# Patient Record
Sex: Male | Born: 1965 | Race: Black or African American | Hispanic: No | Marital: Married | Smoking: Never smoker
Health system: Southern US, Community
[De-identification: ages and names within clinical notes are randomized; demographics above are authoritative.]

## PROBLEM LIST (undated history)

## (undated) DIAGNOSIS — K432 Incisional hernia without obstruction or gangrene: Secondary | ICD-10-CM

## (undated) DIAGNOSIS — R51 Headache: Secondary | ICD-10-CM

## (undated) DIAGNOSIS — E1140 Type 2 diabetes mellitus with diabetic neuropathy, unspecified: Secondary | ICD-10-CM

## (undated) DIAGNOSIS — I10 Essential (primary) hypertension: Secondary | ICD-10-CM

## (undated) DIAGNOSIS — R519 Headache, unspecified: Secondary | ICD-10-CM

## (undated) DIAGNOSIS — E119 Type 2 diabetes mellitus without complications: Secondary | ICD-10-CM

## (undated) HISTORY — PX: OTHER SURGICAL HISTORY: SHX169

---

## 2014-01-04 DIAGNOSIS — K432 Incisional hernia without obstruction or gangrene: Secondary | ICD-10-CM

## 2014-01-04 HISTORY — PX: APPENDECTOMY: SHX54

## 2014-01-04 HISTORY — DX: Incisional hernia without obstruction or gangrene: K43.2

## 2015-05-04 ENCOUNTER — Inpatient Hospital Stay (HOSPITAL_COMMUNITY)
Admission: EM | Admit: 2015-05-04 | Discharge: 2015-05-07 | DRG: 638 | Disposition: A | Discharge status: Home / Self Care | Payer: Self-pay | Attending: Family Medicine | Admitting: Family Medicine

## 2015-05-04 ENCOUNTER — Encounter (HOSPITAL_COMMUNITY): Payer: Self-pay | Admitting: Emergency Medicine

## 2015-05-04 ENCOUNTER — Emergency Department (HOSPITAL_COMMUNITY): Payer: Self-pay

## 2015-05-04 DIAGNOSIS — R000 Tachycardia, unspecified: Secondary | ICD-10-CM | POA: Diagnosis present

## 2015-05-04 DIAGNOSIS — E871 Hypo-osmolality and hyponatremia: Secondary | ICD-10-CM | POA: Diagnosis present

## 2015-05-04 DIAGNOSIS — E1110 Type 2 diabetes mellitus with ketoacidosis without coma: Secondary | ICD-10-CM | POA: Diagnosis present

## 2015-05-04 DIAGNOSIS — E1310 Other specified diabetes mellitus with ketoacidosis without coma: Principal | ICD-10-CM | POA: Diagnosis present

## 2015-05-04 DIAGNOSIS — R066 Hiccough: Secondary | ICD-10-CM | POA: Diagnosis present

## 2015-05-04 DIAGNOSIS — E875 Hyperkalemia: Secondary | ICD-10-CM | POA: Diagnosis present

## 2015-05-04 DIAGNOSIS — R358 Other polyuria: Secondary | ICD-10-CM | POA: Diagnosis present

## 2015-05-04 DIAGNOSIS — E0810 Diabetes mellitus due to underlying condition with ketoacidosis without coma: Secondary | ICD-10-CM | POA: Insufficient documentation

## 2015-05-04 DIAGNOSIS — E860 Dehydration: Secondary | ICD-10-CM | POA: Diagnosis present

## 2015-05-04 DIAGNOSIS — N179 Acute kidney failure, unspecified: Secondary | ICD-10-CM | POA: Diagnosis present

## 2015-05-04 DIAGNOSIS — Z833 Family history of diabetes mellitus: Secondary | ICD-10-CM

## 2015-05-04 DIAGNOSIS — R631 Polydipsia: Secondary | ICD-10-CM | POA: Diagnosis present

## 2015-05-04 HISTORY — DX: Incisional hernia without obstruction or gangrene: K43.2

## 2015-05-04 LAB — CBC WITH DIFFERENTIAL/PLATELET
Basophils Absolute: 0 10*3/uL (ref 0.0–0.1)
Basophils Relative: 0 %
EOS ABS: 0 10*3/uL (ref 0.0–0.7)
Eosinophils Relative: 0 %
HEMATOCRIT: 46.6 % (ref 39.0–52.0)
HEMOGLOBIN: 15.4 g/dL (ref 13.0–17.0)
LYMPHS ABS: 1.2 10*3/uL (ref 0.7–4.0)
Lymphocytes Relative: 15 %
MCH: 26.8 pg (ref 26.0–34.0)
MCHC: 33 g/dL (ref 30.0–36.0)
MCV: 81 fL (ref 78.0–100.0)
MONOS PCT: 7 %
Monocytes Absolute: 0.5 10*3/uL (ref 0.1–1.0)
NEUTROS ABS: 6.3 10*3/uL (ref 1.7–7.7)
NEUTROS PCT: 79 %
Platelets: 166 10*3/uL (ref 150–400)
RBC: 5.75 MIL/uL (ref 4.22–5.81)
RDW: 16.4 % — ABNORMAL HIGH (ref 11.5–15.5)
WBC: 8 10*3/uL (ref 4.0–10.5)

## 2015-05-04 LAB — COMPREHENSIVE METABOLIC PANEL
ALT: 22 U/L (ref 17–63)
AST: 16 U/L (ref 15–41)
Albumin: 4.1 g/dL (ref 3.5–5.0)
Alkaline Phosphatase: 112 U/L (ref 38–126)
Anion gap: 24 — ABNORMAL HIGH (ref 5–15)
BUN: 25 mg/dL — ABNORMAL HIGH (ref 6–20)
CO2: 12 mmol/L — ABNORMAL LOW (ref 22–32)
Calcium: 9.2 mg/dL (ref 8.9–10.3)
Chloride: 88 mmol/L — ABNORMAL LOW (ref 101–111)
Creatinine, Ser: 2.25 mg/dL — ABNORMAL HIGH (ref 0.61–1.24)
GFR calc Af Amer: 37 mL/min — ABNORMAL LOW (ref >60)
GFR calc non Af Amer: 32 mL/min — ABNORMAL LOW (ref >60)
Glucose, Bld: 909 mg/dL (ref 65–99)
Potassium: 5.3 mmol/L — ABNORMAL HIGH (ref 3.5–5.1)
Sodium: 124 mmol/L — ABNORMAL LOW (ref 135–145)
Total Bilirubin: 1.5 mg/dL — ABNORMAL HIGH (ref 0.3–1.2)
Total Protein: 8.2 g/dL — ABNORMAL HIGH (ref 6.5–8.1)

## 2015-05-04 LAB — I-STAT ARTERIAL BLOOD GAS, ED
Acid-base deficit: 17 mmol/L — ABNORMAL HIGH (ref 0.0–2.0)
Bicarbonate: 9.3 mEq/L — ABNORMAL LOW (ref 20.0–24.0)
O2 Saturation: 94 %
Patient temperature: 97.9
TCO2: 10 mmol/L (ref 0–100)
pCO2 arterial: 23.8 mmHg — ABNORMAL LOW (ref 35.0–45.0)
pH, Arterial: 7.196 — CL (ref 7.350–7.450)
pO2, Arterial: 82 mmHg (ref 80.0–100.0)

## 2015-05-04 LAB — URINALYSIS, ROUTINE W REFLEX MICROSCOPIC
Bilirubin Urine: NEGATIVE
Glucose, UA: >1000 mg/dL — AB
Ketones, ur: 40 mg/dL — AB
Leukocytes, UA: NEGATIVE
Nitrite: NEGATIVE
Protein, ur: NEGATIVE mg/dL
Specific Gravity, Urine: 1.034 — ABNORMAL HIGH (ref 1.005–1.030)
pH: 5 (ref 5.0–8.0)

## 2015-05-04 LAB — CBG MONITORING, ED
GLUCOSE-CAPILLARY: 597 mg/dL — AB (ref 65–99)
GLUCOSE-CAPILLARY: 528 mg/dL — AB (ref 65–99)
GLUCOSE-CAPILLARY: 197 mg/dL — AB (ref 65–99)
Glucose-Capillary: 529 mg/dL — ABNORMAL HIGH (ref 65–99)
Glucose-Capillary: 389 mg/dL — ABNORMAL HIGH (ref 65–99)
Glucose-Capillary: 333 mg/dL — ABNORMAL HIGH (ref 65–99)
Glucose-Capillary: 254 mg/dL — ABNORMAL HIGH (ref 65–99)

## 2015-05-04 LAB — BASIC METABOLIC PANEL
ANION GAP: 16 — AB (ref 5–15)
ANION GAP: 13 (ref 5–15)
BUN: 25 mg/dL — ABNORMAL HIGH (ref 6–20)
BUN: 23 mg/dL — ABNORMAL HIGH (ref 6–20)
CALCIUM: 9 mg/dL (ref 8.9–10.3)
CHLORIDE: 110 mmol/L (ref 101–111)
CO2: 13 mmol/L — AB (ref 22–32)
CO2: 14 mmol/L — ABNORMAL LOW (ref 22–32)
Calcium: 8.4 mg/dL — ABNORMAL LOW (ref 8.9–10.3)
Chloride: 107 mmol/L (ref 101–111)
Creatinine, Ser: 1.76 mg/dL — ABNORMAL HIGH (ref 0.61–1.24)
Creatinine, Ser: 1.43 mg/dL — ABNORMAL HIGH (ref 0.61–1.24)
GFR calc non Af Amer: 43 mL/min — ABNORMAL LOW (ref >60)
GFR, EST AFRICAN AMERICAN: 50 mL/min — AB (ref >60)
GFR, EST NON AFRICAN AMERICAN: 55 mL/min — AB (ref >60)
GLUCOSE: 340 mg/dL — AB (ref 65–99)
Glucose, Bld: 226 mg/dL — ABNORMAL HIGH (ref 65–99)
POTASSIUM: 4 mmol/L (ref 3.5–5.1)
Potassium: 3.7 mmol/L (ref 3.5–5.1)
SODIUM: 137 mmol/L (ref 135–145)
Sodium: 136 mmol/L (ref 135–145)

## 2015-05-04 LAB — TROPONIN I: Troponin I: <0.03 ng/mL (ref <0.031)

## 2015-05-04 LAB — LIPID PANEL
CHOL/HDL RATIO: 8.6 ratio
CHOLESTEROL: 206 mg/dL — AB (ref 0–200)
HDL: 24 mg/dL — ABNORMAL LOW (ref >40)
LDL Cholesterol: 145 mg/dL — ABNORMAL HIGH (ref 0–99)
TRIGLYCERIDES: 187 mg/dL — AB (ref <150)
VLDL: 37 mg/dL (ref 0–40)

## 2015-05-04 LAB — GLUCOSE, CAPILLARY
Glucose-Capillary: 175 mg/dL — ABNORMAL HIGH (ref 65–99)
Glucose-Capillary: 183 mg/dL — ABNORMAL HIGH (ref 65–99)

## 2015-05-04 LAB — I-STAT CG4 LACTIC ACID, ED
Lactic Acid, Venous: 1.83 mmol/L (ref 0.5–2.0)
Lactic Acid, Venous: 1.71 mmol/L (ref 0.5–2.0)

## 2015-05-04 LAB — D-DIMER, QUANTITATIVE (NOT AT ARMC): D DIMER QUANT: 6.43 ug{FEU}/mL — AB (ref 0.00–0.50)

## 2015-05-04 LAB — URINE MICROSCOPIC-ADD ON
BACTERIA UA: NONE SEEN
RBC / HPF: NONE SEEN RBC/hpf (ref 0–5)
WBC, UA: NONE SEEN WBC/hpf (ref 0–5)

## 2015-05-04 LAB — MAGNESIUM: Magnesium: 2 mg/dL (ref 1.7–2.4)

## 2015-05-04 LAB — TSH: TSH: 1.505 u[IU]/mL (ref 0.350–4.500)

## 2015-05-04 MED ORDER — SODIUM CHLORIDE 0.9 % IV BOLUS (SEPSIS)
1000.0000 mL | Freq: Once | INTRAVENOUS | Status: AC
  Administered 2015-05-04: 1000 mL via INTRAVENOUS

## 2015-05-04 MED ORDER — ONDANSETRON HCL 4 MG/2ML IJ SOLN: 4.0000 mg | Freq: Once | INTRAMUSCULAR | Status: DC

## 2015-05-04 MED ORDER — SODIUM CHLORIDE 0.9 % IV SOLN
INTRAVENOUS | Status: AC
  Administered 2015-05-04: 16:00:00 via INTRAVENOUS

## 2015-05-04 MED ORDER — SODIUM CHLORIDE 0.9 % IV BOLUS (SEPSIS): 1000.0000 mL | Freq: Once | INTRAVENOUS | Status: DC

## 2015-05-04 MED ORDER — DEXTROSE-NACL 5-0.45 % IV SOLN: INTRAVENOUS | Status: DC

## 2015-05-04 MED ORDER — INSULIN REGULAR HUMAN 100 UNIT/ML IJ SOLN
INTRAMUSCULAR | Status: DC
  Administered 2015-05-04: 7.8 [IU]/h via INTRAVENOUS
  Filled 2015-05-04: qty 2.5

## 2015-05-04 MED ORDER — SODIUM CHLORIDE 0.9 % IV SOLN: INTRAVENOUS | Status: DC

## 2015-05-04 MED ORDER — CHLORPROMAZINE HCL 25 MG PO TABS
50.0000 mg | ORAL_TABLET | Freq: Once | ORAL | Status: AC
  Administered 2015-05-04: 50 mg via ORAL
  Filled 2015-05-04: qty 2

## 2015-05-04 MED ORDER — CHLORPROMAZINE HCL 50 MG PO TABS
50.0000 mg | ORAL_TABLET | Freq: Four times a day (QID) | ORAL | Status: DC | PRN
  Administered 2015-05-04 – 2015-05-07 (×7): 50 mg via ORAL
  Filled 2015-05-04 (×8): qty 1
  Filled 2015-05-04: qty 2
  Filled 2015-05-04 (×3): qty 1

## 2015-05-04 MED ORDER — SODIUM CHLORIDE 0.9 % IV SOLN
INTRAVENOUS | Status: DC
  Administered 2015-05-04: 20:00:00 via INTRAVENOUS

## 2015-05-04 MED ORDER — ENOXAPARIN SODIUM 40 MG/0.4ML ~~LOC~~ SOLN
40.0000 mg | SUBCUTANEOUS | Status: DC
  Administered 2015-05-04 – 2015-05-06 (×3): 40 mg via SUBCUTANEOUS
  Filled 2015-05-04 (×4): qty 0.4

## 2015-05-04 MED ORDER — SODIUM CHLORIDE 0.9 % IV SOLN
INTRAVENOUS | Status: DC
  Administered 2015-05-04: 5.3 [IU]/h via INTRAVENOUS
  Filled 2015-05-04: qty 2.5

## 2015-05-04 MED ORDER — DEXTROSE-NACL 5-0.45 % IV SOLN
INTRAVENOUS | Status: DC
  Administered 2015-05-04: 22:00:00 via INTRAVENOUS

## 2015-05-04 NOTE — ED Notes (Signed)
Pt. Stated, I've had hiccups for a week.

## 2015-05-04 NOTE — Progress Notes (Signed)
ABG critical values given to G. Jamie BrookesNikolich, Charity fundraiserN by Kathie RhodesS. Shelly Shoultz, RRT at 13:55 on 05/04/2015.

## 2015-05-04 NOTE — ED Notes (Signed)
Critical lab glucose 909 notified Dr Manus Gunningancour.

## 2015-05-04 NOTE — ED Provider Notes (Signed)
CSN: [id number]     Arrival date & time 05/04/15  0831 History   First MD Initiated Contact with Patient 05/04/15 1139     Chief Complaint  Patient presents with  . Hiccups     (Consider location/radiation/quality/duration/timing/severity/associated sxs/prior Treatment) HPI Comments: Patient reports persistent hiccups for the past one week, that come and go. History of similar problem in the past after he had surgery. States he said that at some point that he does have some few episodes of vomiting. Also has fleeting pain in his chest lasting just a few seconds when hiccups are severe. Denies any cardiac history or pulmonary history. He is not a smoker. Denies any fever or chills. Denies any diarrhea or constipation. Denies any testicular pain. States he has a large ventral hernia after having appendectomy surgery last year that he is scheduled to have fixed. It is not hurting him today. Denies any fever.  The history is provided by the patient and a relative.    Past Medical History  Diagnosis Date  . Incisional hernia 2016   Past Surgical History  Procedure Laterality Date  . Appendectomy  2016    open   Family History  Problem Relation Age of Onset  . Diabetes Father    Social History  Substance Use Topics  . Smoking status: Never Smoker   . Smokeless tobacco: Never Used  . Alcohol Use: Yes     Comment: 2 beers a week    Review of Systems  Constitutional: Positive for activity change, appetite change and fatigue. Negative for fever.  HENT: Negative for congestion and rhinorrhea.   Respiratory: Negative for cough, chest tightness and shortness of breath.   Cardiovascular: Negative for chest pain.  Gastrointestinal: Negative for nausea, vomiting, abdominal pain and diarrhea.  Genitourinary: Negative for dysuria, urgency and hematuria.  Musculoskeletal: Negative for myalgias and arthralgias.  Skin: Negative for rash.  Neurological: Positive for weakness. Negative for  dizziness and headaches.  A complete 10 system review of systems was obtained and all systems are negative except as noted in the HPI and PMH.      Allergies  Review of patient's allergies indicates no known allergies.  Home Medications   Prior to Admission medications   Medication Sig Start Date End Date Taking? Authorizing Provider  naproxen sodium (ALEVE) 220 MG tablet Take 220 mg by mouth 2 (two) times daily as needed.   Yes Historical Provider, MD   BP 106/75 mmHg  Pulse 123  Temp(Src) 97.9 F (36.6 C) (Oral)  Resp 22  Wt 234 lb 1.6 oz (106.187 kg)  SpO2 95% Physical Exam  Constitutional: He is oriented to person, place, and time. He appears well-developed and well-nourished. No distress.  HENT:  Head: Normocephalic and atraumatic.  Mouth/Throat: Oropharynx is clear and moist. No oropharyngeal exudate.  Dry mucous membranes, actively hiccuping  Eyes: Conjunctivae and EOM are normal. Pupils are equal, round, and reactive to light.  Neck: Normal range of motion. Neck supple.  No meningismus.  Cardiovascular: Normal rate, normal heart sounds and intact distal pulses.   No murmur heard. tachycardic  Pulmonary/Chest: Effort normal and breath sounds normal. No respiratory distress.  Abdominal: Soft. There is no tenderness. There is no rebound and no guarding.  Large surgical incision in the midline with ventral hernia extending inferiorly. It is nontender. It is soft and reducible.  Musculoskeletal: Normal range of motion. He exhibits no edema or tenderness.  Neurological: He is alert and oriented to person, place,  and time. No cranial nerve deficit. He exhibits normal muscle tone. Coordination normal.  No ataxia on finger to nose bilaterally. No pronator drift. 5/5 strength throughout. CN 2-12 intact.Equal grip strength. Sensation intact.   Skin: Skin is warm.  Psychiatric: He has a normal mood and affect. His behavior is normal.  Nursing note and vitals reviewed.   ED  Course  Procedures (including critical care time) Labs Review Labs Reviewed  CBC WITH DIFFERENTIAL/PLATELET - Abnormal; Notable for the following:    RDW 16.4 (*)    All other components within normal limits  COMPREHENSIVE METABOLIC PANEL - Abnormal; Notable for the following:    Sodium 124 (*)    Potassium 5.3 (*)    Chloride 88 (*)    CO2 12 (*)    Glucose, Bld 909 (*)    BUN 25 (*)    Creatinine, Ser 2.25 (*)    Total Protein 8.2 (*)    Total Bilirubin 1.5 (*)    GFR calc non Af Amer 32 (*)    GFR calc Af Amer 37 (*)    Anion gap 24 (*)    All other components within normal limits  D-DIMER, QUANTITATIVE (NOT AT [facility]) - Abnormal; Notable for the following:    D-Dimer, Quant 6.43 (*)    All other components within normal limits  URINALYSIS, ROUTINE W REFLEX MICROSCOPIC (NOT AT [facility]) - Abnormal; Notable for the following:    APPearance CLOUDY (*)    Specific Gravity, Urine 1.034 (*)    Glucose, UA >1000 (*)    Hgb urine dipstick TRACE (*)    Ketones, ur 40 (*)    All other components within normal limits  URINE MICROSCOPIC-ADD ON - Abnormal; Notable for the following:    Squamous Epithelial / LPF 0-5 (*)    All other components within normal limits  I-STAT ARTERIAL BLOOD GAS, ED - Abnormal; Notable for the following:    pH, Arterial 7.196 (*)    pCO2 arterial 23.8 (*)    Bicarbonate 9.3 (*)    Acid-base deficit 17.0 (*)    All other components within normal limits  CBG MONITORING, ED - Abnormal; Notable for the following:    Glucose-Capillary 597 (*)    All other components within normal limits  CBG MONITORING, ED - Abnormal; Notable for the following:    Glucose-Capillary 529 (*)    All other components within normal limits  TROPONIN I  I-STAT CG4 LACTIC ACID, ED  I-STAT CG4 LACTIC ACID, ED    Imaging Review Dg Chest 2 View  05/04/2015  CLINICAL DATA:  Hiccoughs EXAM: CHEST  2 VIEW COMPARISON:  None. FINDINGS: Normal heart size. Possible posterior mid lung nodule  is noted on the lateral view. No pneumothorax. No pleural effusion. IMPRESSION: Possible lung nodule. Comparison a prior studies are recommended. If none are available, CT can be performed. Electronically Signed   By: Jolaine Click M.D.   On: 05/04/2015 13:58   I have personally reviewed and evaluated these images and lab results as part of my medical decision-making.   EKG Interpretation   Date/Time:  Sunday May 04 2015 12:08:04 EDT Ventricular Rate:  109 PR Interval:  144 QRS Duration: 106 QT Interval:  342 QTC Calculation: 460 R Axis:   76 Text Interpretation:  Sinus tachycardia No previous ECGs available  Confirmed by Manus Gunning  MD, Coby Antrobus [phone]) on 05/04/2015 12:28:21 PM      MDM   Final diagnoses:  Diabetic ketoacidosis without coma  associated with diabetes mellitus due to underlying condition (HCC)  Hyponatremia  Acute renal failure, unspecified acute renal failure type (HCC)   Hiccups for the past one week with intermittent nausea and vomiting. Ventral hernia is soft.   Patient is tachycardic in the 100s. He'll be started on IV fluids given his dry appareance  Labs show evidence of new diabetes and DKA. Sugar is 900 with hyperkalemia and hyponatremia and elevated anion gap with acute renal failure.  Start IV insulin and DKA protocol with additional IV fluids. PH 7.19. Patient mentating well protecting airway. Unassigned admission d/w [facility] residents.  They are aware of elevated D-dimer. May require VQ scan as renal failure precludes CTPE.  Remains tachycardic to 110-120s but no hypoxia or respiratory distress.   CRITICAL CARE Performed by: Glynn Octave Total critical care time: 45 minutes Critical care time was exclusive of separately billable procedures and treating other patients. Critical care was necessary to treat or prevent imminent or life-threatening deterioration. Critical care was time spent personally by me on the following activities: development of  treatment plan with patient and/or surrogate as well as nursing, discussions with consultants, evaluation of patient's response to treatment, examination of patient, obtaining history from patient or surrogate, ordering and performing treatments and interventions, ordering and review of laboratory studies, ordering and review of radiographic studies, pulse oximetry and re-evaluation of patient's condition.     Glynn Octave, MD 05/04/15 2255

## 2015-05-04 NOTE — H&P (Signed)
[facility] and Physical Service Pager: [phone]  Patient name: Mark Miles Miles Medical record number: [id number] Date of birth: [birth year] Age: 50 y.o. Gender: male  Primary Care Provider: No primary care provider on file. Consultants: none Code Status: FULL  Chief Complaint: feeling weak and hiccups  Assessment and Plan: Mark Miles Kjos is a 50 y.o. male presenting with hiccups and weakness. No significant PMH.   DKA: CBG 900, anion gap 32, K 5.3, Na 124 on admission. ABG pH 7.2, bicarb 9.3. Lactic acid WNL. No prior DM diagnosis, reports not being tested for it over past several years/limited doctor visits. Sodium corrects to 143.  - Admit to SDU, attending Dr. Gwendolyn GrantWalden - Continue insulin drip per protocol - NPO while on insulin drip, ice chips okay - Transition to subq insulin when CBG <250 - q4 BMP - F/u A1C, TSH, lipid panel, mag  Hiccups: Previously treated with benztropine.  - Begin Thorazine  AKI: Likely 2/2 dehydration. Cr 2.2 on admission.  - Cont IVF per DKA protocol - Continue to monitor   Elevated D-dimer: ordered in ED, 6.43. He is not in respiratory distress, SpO2 maintaining upper 90s-100 on room air, not complaining of dyspnea (though hiccups as above). No leg swelling. No prior history of DVT/PE. This most likely represents severe dehydration. - V/Q scan ordered in ED  FEN/GI: NPO while on insulin drip Prophylaxis: Lovenox  Disposition: admit to SDU  History of Present Illness:  Mark Miles Mark Miles is a 50 y.o. male presenting with hiccups and found to be in DKA.  Patient reports worsening hiccups for the past 5 days. Reports another prolonged episode of hiccups about six months ago, and eventually resolved after he was prescribed benztropine (though says benztropine did not really help his symptoms). He has tried various home remedies with no symptomatic relief. Patient says that he finally came to the hospital because  he was hiccuping so much that he cannot sleep and has started to feel weak.   Also endorses recent polyuria and polydipsia, as well as 20lb weight loss in past two weeks. Endorses blurry vision and vomiting over the past few days. Denies hematemesis. Denies recent illness. Denies prior diagnosis of high blood sugar or diabetes. Family history of father with diabetes.   Review Of Systems: Per HPI with the following additions: blurry vision, no SOB, CP, nauseas, polyuria but no dysuria. Otherwise the remainder of the systems were negative.  Patient Active Problem List   Diagnosis Date Noted  . DKA (diabetic ketoacidoses) (HCC) 05/04/2015    Past Medical History: Past Medical History  Diagnosis Date  . Incisional hernia 2016    Denies any history of medical problems  Past Surgical History: Past Surgical History  Procedure Laterality Date  . Appendectomy  2016    open    Social History: Social History  Substance Use Topics  . Smoking status: Never Smoker   . Smokeless tobacco: Never Used  . Alcohol Use: Yes     Comment: 2 beers a week   Additional social history: none Please also refer to relevant sections of EMR.  Family History: Family History  Problem Relation Age of Onset  . Diabetes Father     Allergies and Medications: No Known Allergies No current facility-administered medications on file prior to encounter.   No current outpatient prescriptions on file prior to encounter.  Does not take anything regularly.  Objective: BP 130/74 mmHg  Pulse 100  Temp(Src) 97.9 F (36.6 C) (Oral)  Resp 20  Wt 234 lb 1.6 oz (106.187 kg)  SpO2 97% Exam: General: sitting in bed in NAD; hiccupping every 5-15s throughout encounter Eyes: PERRLA, EOMI, no scleral icterus or discharge ENTM: slightly dry mucous membranes; no oropharyngeal erythema or exudates Neck: supple, no lymphadenopathy Cardiovascular: RRR, no murmurs appreciated Respiratory: CTAB, no wheezes Abdomen:  large ventral hernia with overlying well-healed surgical incision, soft, non-tender, reducible; abdomen soft, non-tender, +BS  MSK: 5/5 strength upper and lower extremities bilaterally Skin: warm and dry; no rashes or bruises noted Neuro: A&Ox3; no focal deficits Psych: appropriate mood and affect  Labs and Imaging: CBC BMET   Recent Labs Lab 05/04/15 1220  WBC 8.0  HGB 15.4  HCT 46.6  PLT 166    Recent Labs Lab 05/04/15 1220  NA 124*  K 5.3*  CL 88*  CO2 12*  BUN 25*  CREATININE 2.25*  GLUCOSE 909*  CALCIUM 9.2     Dg Chest 2 View  05/04/2015  CLINICAL DATA:  Hiccoughs EXAM: CHEST  2 VIEW COMPARISON:  None. FINDINGS: Normal heart size. Possible posterior mid lung nodule is noted on the lateral view. No pneumothorax. No pleural effusion. IMPRESSION: Possible lung nodule. Comparison a prior studies are recommended. If none are available, CT can be performed. Electronically Signed   By: Jolaine Click M.D.   On: 05/04/2015 13:58    Abigail Shelbie Hutching, MD 05/04/2015, 3:13 PM PGY-1, [facility] Family Medicine FPTS Intern pager: [phone], text pages welcome  I have seen and examined the patient. I have read and agree with the above note. My changes are noted in blue.  Tawni Carnes, MD 05/04/2015, 3:45 PM PGY-3, [facility] Family Medicine FPTS Intern Pager: [phone], text pages welcome

## 2015-05-05 LAB — BASIC METABOLIC PANEL
ANION GAP: 10 (ref 5–15)
Anion gap: 10 (ref 5–15)
Anion gap: 11 (ref 5–15)
Anion gap: 9 (ref 5–15)
Anion gap: 9 (ref 5–15)
BUN: 13 mg/dL (ref 6–20)
BUN: 16 mg/dL (ref 6–20)
BUN: 18 mg/dL (ref 6–20)
BUN: 19 mg/dL (ref 6–20)
BUN: 21 mg/dL — AB (ref 6–20)
CALCIUM: 8.3 mg/dL — AB (ref 8.9–10.3)
CALCIUM: 8.3 mg/dL — AB (ref 8.9–10.3)
CALCIUM: 8.4 mg/dL — AB (ref 8.9–10.3)
CHLORIDE: 107 mmol/L (ref 101–111)
CHLORIDE: 109 mmol/L (ref 101–111)
CHLORIDE: 111 mmol/L (ref 101–111)
CHLORIDE: 112 mmol/L — AB (ref 101–111)
CO2: 16 mmol/L — ABNORMAL LOW (ref 22–32)
CO2: 17 mmol/L — AB (ref 22–32)
CO2: 16 mmol/L — AB (ref 22–32)
CO2: 18 mmol/L — AB (ref 22–32)
CO2: 18 mmol/L — AB (ref 22–32)
CREATININE: 1 mg/dL (ref 0.61–1.24)
CREATININE: 1.12 mg/dL (ref 0.61–1.24)
CREATININE: 1.14 mg/dL (ref 0.61–1.24)
CREATININE: 1.25 mg/dL — AB (ref 0.61–1.24)
Calcium: 7.9 mg/dL — ABNORMAL LOW (ref 8.9–10.3)
Calcium: 8 mg/dL — ABNORMAL LOW (ref 8.9–10.3)
Chloride: 102 mmol/L (ref 101–111)
Creatinine, Ser: 1.22 mg/dL (ref 0.61–1.24)
GFR calc Af Amer: >60 mL/min (ref >60)
GFR calc Af Amer: >60 mL/min (ref >60)
GFR calc Af Amer: >60 mL/min (ref >60)
GFR calc Af Amer: >60 mL/min (ref >60)
GFR calc Af Amer: >60 mL/min (ref >60)
GFR calc non Af Amer: >60 mL/min (ref >60)
GFR calc non Af Amer: >60 mL/min (ref >60)
GFR calc non Af Amer: >60 mL/min (ref >60)
GFR calc non Af Amer: >60 mL/min (ref >60)
GFR calc non Af Amer: >60 mL/min (ref >60)
GLUCOSE: 161 mg/dL — AB (ref 65–99)
GLUCOSE: 169 mg/dL — AB (ref 65–99)
GLUCOSE: 201 mg/dL — AB (ref 65–99)
GLUCOSE: 298 mg/dL — AB (ref 65–99)
GLUCOSE: 444 mg/dL — AB (ref 65–99)
POTASSIUM: 3.5 mmol/L (ref 3.5–5.1)
POTASSIUM: 3.7 mmol/L (ref 3.5–5.1)
Potassium: 3.2 mmol/L — ABNORMAL LOW (ref 3.5–5.1)
Potassium: 3.6 mmol/L (ref 3.5–5.1)
Potassium: 3.6 mmol/L (ref 3.5–5.1)
SODIUM: 133 mmol/L — AB (ref 135–145)
SODIUM: 129 mmol/L — AB (ref 135–145)
Sodium: 136 mmol/L (ref 135–145)
Sodium: 138 mmol/L (ref 135–145)
Sodium: 139 mmol/L (ref 135–145)

## 2015-05-05 LAB — GLUCOSE, CAPILLARY
GLUCOSE-CAPILLARY: 138 mg/dL — AB (ref 65–99)
GLUCOSE-CAPILLARY: 142 mg/dL — AB (ref 65–99)
GLUCOSE-CAPILLARY: 163 mg/dL — AB (ref 65–99)
GLUCOSE-CAPILLARY: 189 mg/dL — AB (ref 65–99)
GLUCOSE-CAPILLARY: 219 mg/dL — AB (ref 65–99)
GLUCOSE-CAPILLARY: 289 mg/dL — AB (ref 65–99)
GLUCOSE-CAPILLARY: 298 mg/dL — AB (ref 65–99)
Glucose-Capillary: 136 mg/dL — ABNORMAL HIGH (ref 65–99)
Glucose-Capillary: 187 mg/dL — ABNORMAL HIGH (ref 65–99)
Glucose-Capillary: 165 mg/dL — ABNORMAL HIGH (ref 65–99)
Glucose-Capillary: 138 mg/dL — ABNORMAL HIGH (ref 65–99)
Glucose-Capillary: 364 mg/dL — ABNORMAL HIGH (ref 65–99)
Glucose-Capillary: 445 mg/dL — ABNORMAL HIGH (ref 65–99)

## 2015-05-05 LAB — MRSA PCR SCREENING: MRSA by PCR: NEGATIVE

## 2015-05-05 MED ORDER — INSULIN GLARGINE 100 UNIT/ML ~~LOC~~ SOLN
50.0000 [IU] | Freq: Every day | SUBCUTANEOUS | Status: DC
  Filled 2015-05-05: qty 0.5

## 2015-05-05 MED ORDER — INSULIN ASPART 100 UNIT/ML ~~LOC~~ SOLN
8.0000 [IU] | Freq: Once | SUBCUTANEOUS | Status: AC
  Administered 2015-05-05: 8 [IU] via SUBCUTANEOUS

## 2015-05-05 MED ORDER — INSULIN ASPART 100 UNIT/ML ~~LOC~~ SOLN: 0.0000 [IU] | Freq: Every day | SUBCUTANEOUS | Status: DC

## 2015-05-05 MED ORDER — LIVING WELL WITH DIABETES BOOK
Freq: Once | Status: AC
  Administered 2015-05-05: 12:00:00
  Filled 2015-05-05: qty 1

## 2015-05-05 MED ORDER — INSULIN ASPART 100 UNIT/ML ~~LOC~~ SOLN
0.0000 [IU] | Freq: Three times a day (TID) | SUBCUTANEOUS | Status: DC
  Administered 2015-05-06: 11 [IU] via SUBCUTANEOUS

## 2015-05-05 MED ORDER — INSULIN GLARGINE 100 UNIT/ML ~~LOC~~ SOLN
40.0000 [IU] | Freq: Every day | SUBCUTANEOUS | Status: DC
  Filled 2015-05-05: qty 0.4

## 2015-05-05 MED ORDER — INSULIN ASPART 100 UNIT/ML ~~LOC~~ SOLN
0.0000 [IU] | Freq: Three times a day (TID) | SUBCUTANEOUS | Status: DC
  Administered 2015-05-05: 7 [IU] via SUBCUTANEOUS
  Administered 2015-05-05: 5 [IU] via SUBCUTANEOUS

## 2015-05-05 MED ORDER — POTASSIUM CHLORIDE 10 MEQ/100ML IV SOLN
10.0000 meq | INTRAVENOUS | Status: AC
  Administered 2015-05-05 (×2): 10 meq via INTRAVENOUS
  Filled 2015-05-05: qty 100

## 2015-05-05 MED ORDER — INSULIN GLARGINE 100 UNIT/ML ~~LOC~~ SOLN
40.0000 [IU] | Freq: Every day | SUBCUTANEOUS | Status: DC
  Administered 2015-05-05: 40 [IU] via SUBCUTANEOUS
  Filled 2015-05-05: qty 0.4

## 2015-05-05 MED ORDER — SODIUM CHLORIDE 0.9 % IV SOLN
INTRAVENOUS | Status: AC
  Administered 2015-05-05: 21:00:00 via INTRAVENOUS

## 2015-05-05 NOTE — Progress Notes (Signed)
Nutrition Consult  RD consulted for nutrition education regarding new onset diabetes. Glucose was > 900 on admission.   No results found for: HGBA1C  RD provided "Carbohydrate Counting for People with Diabetes" handout from the Academy of Nutrition and Dietetics. Discussed different food groups and their effects on blood sugar, emphasizing carbohydrate-containing foods. Provided list of carbohydrates and recommended serving sizes of common foods.  Discussed importance of controlled and consistent carbohydrate intake throughout the day. Provided examples of ways to balance meals/snacks and encouraged intake of high-fiber, whole grain complex carbohydrates. Teach back method used.  Expect fair compliance. Patient not interested in OP diet education at this time. Encouraged him to contact the [facility] for further comprehensive education.  Body mass index is 30.89 kg/(m^2). Pt meets criteria for class 1 obesity based on current BMI.  Current diet order is Heart Healthy / CHO modified, patient is consuming approximately 100% of meals at this time. Labs and medications reviewed. No further nutrition interventions warranted at this time. RD contact information provided. If additional nutrition issues arise, please re-consult RD.  Joaquin CourtsKimberly Beulah Capobianco, RD, LDN, CNSC Pager [phone] After Hours Pager [phone]

## 2015-05-05 NOTE — Progress Notes (Signed)
Inpatient Diabetes Program Recommendations  AACE/ADA: New Consensus Statement on Inpatient Glycemic Control (2015)  Target Ranges:  Prepandial:   less than 140 mg/dL      Peak postprandial:   less than 180 mg/dL (1-2 hours)      Critically ill patients:  140 - 180 mg/dL   Review of Glycemic Control  Inpatient Diabetes Program Recommendations: This coordinator met with patient to discuss new diagnosis.  Pt was sleeping and not feeling like talking at this time.  He has been seen by the RD.  Left patient with a hypo/hyperglycemia handout and a "Know your Numbers" handout.  Will follow-up again tomorrow.  HgbA1C: pending Thank you  Raoul Pitch BSN, RN,CDE Inpatient Diabetes Coordinator [phone] (team pager)

## 2015-05-05 NOTE — Progress Notes (Signed)
Family Medicine Teaching Service Daily Progress Note Intern Pager: [phone]  Patient name: Mark Miles Medical record number: [id number] Date of birth: 1965-03-09 Age: 50 y.o. Gender: male  Primary Care Provider: No primary care provider on file. Consultants: None Code Status: Full   Pt Overview and Major Events to Date:   Assessment and Plan: Mark Miles is a 50 y.o. male presenting with hiccups and weakness. No significant PMH.   DKA: pH on admission 7.2, CBG 900, anion gap 32>9. Currently  K 3.6.  Lactic acid WNL. New onset Diabetes. CBG 138, 163, 219  - D/C insulin drip and D5NS at 10:30 this AM  - According to chart should get 56 units, however due to new onset will start at a  reduced rate of 40 units of Lantus  - Sensitive Sliding scale   -  K 3.6 --> 10 mEq x 2  - Follow up BMET x 3 pm s/p d/c of insulin drip  - F/u A1C - TSH wnl - Diabetic education  - Case management for PCP, insurance, and meds  Hiccups: Previously treated with benztropine.  - Continue Thorazine   Resolved AKI: Likely 2/2 dehydration. Cr 2.2 >1.14  - continue to follow creatinine   Elevated D-dimer: Patient was tachycardic in the ED, and a D-Dimer of 6.43. However patient had no respiratory distress, SpO2 maintaining upper 90s-100 on room air, not complaining of dyspnea (though hiccups as above). No leg swelling. No prior history of DVT/PE. Patient was found to have DKA, and likely severe dehydration causing tachycardia. As D-Dimer elevation is non-specific, and no significant symptoms other the tachycardia, will d/c V/Q scan.   FEN/GI: Carb modified diet Prophylaxis: Lovenox  Disposition: Transition to floor  Subjective:  Patient is hungry. Is excited about the idea of eating soon, otherwise still having hiccups.   Objective: Temp:  [97.9 F (36.6 C)-98.6 F (37 C)] 97.9 F (36.6 C) (05/01 0716) Pulse Rate:  [97-124] 111 (05/01 0245) Resp:  [11-26] 13 (05/01 0245) BP:  (90-153)/(55-104) 119/65 mmHg (05/01 0245) SpO2:  [91 %-100 %] 96 % (05/01 0245) Weight:  [234 lb 1.6 oz (106.187 kg)] 234 lb 1.6 oz (106.187 kg) (04/30 0904) Physical Exam: General: Patient lying in bed in NAD Cardiovascular: RRR, no murmurs  Respiratory: CTAB, Abdomen: BS+, no tender, no distended  Extremities: no lower extremity edema   Laboratory:  CBG (last 3)   Recent Labs  05/05/15 0351 05/05/15 0513 05/05/15 0617  GLUCAP 138* 163* 219*     Recent Labs Lab 05/04/15 1220  WBC 8.0  HGB 15.4  HCT 46.6  PLT 166    Recent Labs Lab 05/04/15 1220  05/04/15 2120 05/04/15 2348 05/05/15 0403  NA 124*  < > 137 136 139  K 5.3*  < > 3.7 3.6 3.6  CL 88*  < > 110 109 112*  CO2 12*  < > 14* 16* 18*  BUN 25*  < > 23* 21* 19  CREATININE 2.25*  < > 1.43* 1.25* 1.14  CALCIUM 9.2  < > 8.4* 8.3* 8.4*  PROT 8.2*  --   --   --   --   BILITOT 1.5*  --   --   --   --   ALKPHOS 112  --   --   --   --   ALT 22  --   --   --   --   AST 16  --   --   --   --  GLUCOSE 909*  < > 226* 201* 161*  < > = values in this interval not displayed.  Imaging/Diagnostic Tests: Dg Chest 2 View  05/04/2015  CLINICAL DATA:  Hiccoughs EXAM: CHEST  2 VIEW COMPARISON:  None. FINDINGS: Normal heart size. Possible posterior mid lung nodule is noted on the lateral view. No pneumothorax. No pleural effusion. IMPRESSION: Possible lung nodule. Comparison a prior studies are recommended. If none are available, CT can be performed. Electronically Signed   By: Jolaine ClickArthur  Hoss M.D.   On: 05/04/2015 13:58    Aleasha Fregeau Mayra ReelZahra Danniel Grenz, MD 05/05/2015, 7:21 AM PGY-1, [facility] FPTS Intern pager: [phone], text pages welcome

## 2015-05-06 LAB — HEMOGLOBIN A1C
Hgb A1c MFr Bld: 12.5 % — ABNORMAL HIGH (ref 4.8–5.6)
Mean Plasma Glucose: 312 mg/dL

## 2015-05-06 LAB — BASIC METABOLIC PANEL
ANION GAP: 10 (ref 5–15)
ANION GAP: 8 (ref 5–15)
Anion gap: 8 (ref 5–15)
BUN: 6 mg/dL (ref 6–20)
BUN: 8 mg/dL (ref 6–20)
BUN: 9 mg/dL (ref 6–20)
CALCIUM: 8 mg/dL — AB (ref 8.9–10.3)
CALCIUM: 8.3 mg/dL — AB (ref 8.9–10.3)
CHLORIDE: 108 mmol/L (ref 101–111)
CO2: 18 mmol/L — AB (ref 22–32)
CO2: 23 mmol/L (ref 22–32)
CO2: 19 mmol/L — AB (ref 22–32)
Calcium: 8.6 mg/dL — ABNORMAL LOW (ref 8.9–10.3)
Chloride: 108 mmol/L (ref 101–111)
Chloride: 109 mmol/L (ref 101–111)
Creatinine, Ser: 0.9 mg/dL (ref 0.61–1.24)
Creatinine, Ser: 0.96 mg/dL (ref 0.61–1.24)
Creatinine, Ser: 0.98 mg/dL (ref 0.61–1.24)
GFR calc Af Amer: >60 mL/min (ref >60)
GFR calc non Af Amer: >60 mL/min (ref >60)
GFR calc non Af Amer: >60 mL/min (ref >60)
GLUCOSE: 169 mg/dL — AB (ref 65–99)
Glucose, Bld: 306 mg/dL — ABNORMAL HIGH (ref 65–99)
Glucose, Bld: 346 mg/dL — ABNORMAL HIGH (ref 65–99)
POTASSIUM: 3.5 mmol/L (ref 3.5–5.1)
Potassium: 3.3 mmol/L — ABNORMAL LOW (ref 3.5–5.1)
Potassium: 3.4 mmol/L — ABNORMAL LOW (ref 3.5–5.1)
SODIUM: 136 mmol/L (ref 135–145)
SODIUM: 136 mmol/L (ref 135–145)
Sodium: 139 mmol/L (ref 135–145)

## 2015-05-06 LAB — GLUCOSE, CAPILLARY
GLUCOSE-CAPILLARY: 322 mg/dL — AB (ref 65–99)
Glucose-Capillary: 323 mg/dL — ABNORMAL HIGH (ref 65–99)
Glucose-Capillary: 268 mg/dL — ABNORMAL HIGH (ref 65–99)
Glucose-Capillary: 144 mg/dL — ABNORMAL HIGH (ref 65–99)

## 2015-05-06 MED ORDER — INSULIN GLARGINE 100 UNIT/ML ~~LOC~~ SOLN
50.0000 [IU] | Freq: Every day | SUBCUTANEOUS | Status: DC
  Administered 2015-05-06 – 2015-05-07 (×2): 50 [IU] via SUBCUTANEOUS
  Filled 2015-05-06 (×3): qty 0.5

## 2015-05-06 MED ORDER — INSULIN ASPART 100 UNIT/ML ~~LOC~~ SOLN
0.0000 [IU] | Freq: Three times a day (TID) | SUBCUTANEOUS | Status: DC
  Administered 2015-05-06: 7 [IU] via SUBCUTANEOUS
  Administered 2015-05-06: 5 [IU] via SUBCUTANEOUS
  Administered 2015-05-07: 3 [IU] via SUBCUTANEOUS
  Administered 2015-05-07: 5 [IU] via SUBCUTANEOUS
  Administered 2015-05-07: 1 [IU] via SUBCUTANEOUS

## 2015-05-06 MED ORDER — INSULIN GLARGINE 100 UNIT/ML ~~LOC~~ SOLN
48.0000 [IU] | Freq: Every day | SUBCUTANEOUS | Status: DC
  Filled 2015-05-06: qty 0.48

## 2015-05-06 MED ORDER — INSULIN STARTER KIT- SYRINGES (ENGLISH)
1.0000 | Freq: Once | Status: DC
  Filled 2015-05-06: qty 1

## 2015-05-06 MED ORDER — POTASSIUM CHLORIDE CRYS ER 20 MEQ PO TBCR
40.0000 meq | EXTENDED_RELEASE_TABLET | Freq: Once | ORAL | Status: AC
  Administered 2015-05-06: 40 meq via ORAL
  Filled 2015-05-06: qty 2

## 2015-05-06 MED ORDER — INSULIN ASPART 100 UNIT/ML ~~LOC~~ SOLN
12.0000 [IU] | Freq: Three times a day (TID) | SUBCUTANEOUS | Status: DC
  Administered 2015-05-06 – 2015-05-07 (×5): 12 [IU] via SUBCUTANEOUS

## 2015-05-06 NOTE — Progress Notes (Signed)
Inpatient Diabetes Program Recommendations  AACE/ADA: New Consensus Statement on Inpatient Glycemic Control (2015)  Target Ranges:  Prepandial:   less than 140 mg/dL      Peak postprandial:   less than 180 mg/dL (1-2 hours)      Critically ill patients:  140 - 180 mg/dL   Review of Glycemic Control  Inpatient Diabetes Program Recommendations:  HgbA1C: 12.5  Pt was sleeping when I tried to visit.  This coordinator spoke with his RN about bedside education.  States she has been encouraging him to view the videos but thus far he has refused.  RN ordered the insulin starter-kit and plans to educate patients wife when she arrives today.  RN will also start the DM videos while the patient eats dinner tonight.   Thank you  Raoul Pitch BSN, RN,CDE Inpatient Diabetes Coordinator [phone] (team pager)

## 2015-05-06 NOTE — Care Management Note (Signed)
Case Management Note  Patient Details  Name: Sabra HeckDavid Damore MRN: [id number] Date of Birth: [birth year]  Subjective/Objective:  Patient lives with spouse, pta indep, here with DKA, being seen by Diabetic Educator,  [facility] and also gave him the broch[facility]  Informed patient that he could go to [facility] to get insulin after discharge .  NCM will cont to follow for dc needs.                  Action/Plan:   Expected Discharge Date:                  Expected Discharge Plan:  Home/Self Care  In-House Referral:     Discharge planning Services  CM Consult, [facility] Clinic, Follow-up appt scheduled  Post Acute Care Choice:    Choice offered to:     DME Arranged:    DME Agency:     HH Arranged:    HH Agency:     Status of Service:  In process, will continue to follow  Medicare Important Message Given:    Date Medicare IM Given:    Medicare IM give by:    Date Additional Medicare IM Given:    Additional Medicare Important Message give by:     If discussed at Long Length of Stay Meetings, dates discussed:    Additional Comments:  Leone Havenaylor, Zyair Rhein Clinton, RN 05/06/2015, 12:53 PM

## 2015-05-06 NOTE — Progress Notes (Signed)
Family Medicine Teaching Service Daily Progress Note Intern Pager: [phone]  Patient name: Mark Miles Medical record number: [id number] Date of birth: 07-Apr-1965 Age: 50 y.o. Gender: male  Primary Care Provider: No primary care provider on file. Consultants: None Code Status: Full   Pt Overview and Major Events to Date:   Assessment and Plan: Mark Miles is a 50 y.o. male presenting with hiccups and weakness. No significant PMH.   New onset Diabetes, Resolved DKA ( Admission pH  7.2, CBG 900, anion gap 32). CBGs 364, 445, 306, 346 (AM)   - Increased Lantus from 40 to 50 units (used a total of 90 units yesterday), meal time coverage 12 units.  - Sensitive Sliding scale and HS coverage  -  K 3.6 > 10 mEq x 2 > 3.4> KDUR 40 meq>  - A1C 12.5  - TSH wnl - Continue Diabetic education  - Case management for PCP, insurance, and meds  Hiccups: Improved. Previously treated with benztropine.  - Continue Thorazine   Resolved AKI: Likely 2/2 dehydration. Cr 2.2 >1.14 > 0.90 - continue to follow creatinine   FEN/GI: Carb modified diet Prophylaxis: Lovenox  Disposition: Transition to floor  Subjective:  Patient doing well today. Would like to go home tomorrow, stressed the need for follow up before discharge. No complaints otherwise   Objective: Temp:  [97.9 F (36.6 C)-99 F (37.2 C)] 98 F (36.7 C) (05/02 0737) Pulse Rate:  [91-118] 91 (05/02 0340) Resp:  [14-22] 14 (05/02 0340) BP: (109-125)/(61-83) 125/83 mmHg (05/02 0340) SpO2:  [98 %-99 %] 98 % (05/02 0340) Physical Exam: General: Patient lying in bed in NAD Cardiovascular: RRR, no murmurs  Respiratory: CTAB, Abdomen: BS+, no tenderness, no distended  Extremities: no lower extremity edema   Laboratory:  CBG (last 3)   Recent Labs  05/05/15 1208 05/05/15 1804 05/05/15 2055  GLUCAP 298* 364* 445*     Recent Labs Lab 05/04/15 1220  WBC 8.0  HGB 15.4  HCT 46.6  PLT 166    Recent Labs Lab  05/04/15 1220  05/05/15 1920 05/06/15 0015 05/06/15 0707  NA 124*  < > 129* 136 136  K 5.3*  < > 3.7 3.3* 3.4*  CL 88*  < > 102 109 108  CO2 12*  < > 17* 19* 18*  BUN 25*  < > 13 9 8   CREATININE 2.25*  < > 1.22 0.96 0.90  CALCIUM 9.2  < > 8.0* 8.3* 8.0*  PROT 8.2*  --   --   --   --   BILITOT 1.5*  --   --   --   --   ALKPHOS 112  --   --   --   --   ALT 22  --   --   --   --   AST 16  --   --   --   --   GLUCOSE 909*  < > 444* 306* 346*  < > = values in this interval not displayed.  Imaging/Diagnostic Tests: No results found.  Lester Crickenberger Mayra ReelZahra Rylyn Ranganathan, MD 05/06/2015, 9:27 AM PGY-1, [facility] Family Medicine FPTS Intern pager: [phone], text pages welcome

## 2015-05-07 LAB — BASIC METABOLIC PANEL
Anion gap: 9 (ref 5–15)
BUN: <5 mg/dL — ABNORMAL LOW (ref 6–20)
CO2: 26 mmol/L (ref 22–32)
Calcium: 8.8 mg/dL — ABNORMAL LOW (ref 8.9–10.3)
Chloride: 105 mmol/L (ref 101–111)
Creatinine, Ser: 0.91 mg/dL (ref 0.61–1.24)
GFR calc Af Amer: >60 mL/min (ref >60)
GFR calc non Af Amer: >60 mL/min (ref >60)
Glucose, Bld: 265 mg/dL — ABNORMAL HIGH (ref 65–99)
Potassium: 3.2 mmol/L — ABNORMAL LOW (ref 3.5–5.1)
Sodium: 140 mmol/L (ref 135–145)

## 2015-05-07 LAB — GLUCOSE, CAPILLARY
GLUCOSE-CAPILLARY: 266 mg/dL — AB (ref 65–99)
GLUCOSE-CAPILLARY: 131 mg/dL — AB (ref 65–99)
Glucose-Capillary: 225 mg/dL — ABNORMAL HIGH (ref 65–99)
Glucose-Capillary: 128 mg/dL — ABNORMAL HIGH (ref 65–99)

## 2015-05-07 MED ORDER — METFORMIN HCL 500 MG PO TABS: 500.0000 mg | ORAL_TABLET | Freq: Every day | ORAL | Status: DC

## 2015-05-07 MED ORDER — BLOOD GLUCOSE MONITOR KIT: PACK | Status: DC

## 2015-05-07 MED ORDER — INSULIN STARTER KIT- SYRINGES (ENGLISH)
1.0000 | Freq: Once | Status: AC
Start: 2015-05-07 — End: 2015-05-07
  Administered 2015-05-07: 1
  Filled 2015-05-07: qty 1

## 2015-05-07 MED ORDER — INSULIN NPH (HUMAN) (ISOPHANE) 100 UNIT/ML ~~LOC~~ SUSP: 25.0000 [IU] | Freq: Two times a day (BID) | SUBCUTANEOUS | Status: DC

## 2015-05-07 MED ORDER — POTASSIUM CHLORIDE CRYS ER 20 MEQ PO TBCR
40.0000 meq | EXTENDED_RELEASE_TABLET | Freq: Once | ORAL | Status: AC
Start: 2015-05-07 — End: 2015-05-07
  Administered 2015-05-07: 40 meq via ORAL
  Filled 2015-05-07: qty 2

## 2015-05-07 NOTE — Discharge Summary (Signed)
[facility] Discharge Summary  Patient name: Mark Miles Medical record number: [id number] Date of birth: [birth year] Age: 50 y.o. Gender: male Date of Admission: 05/04/2015  Date of Discharge: 05/07/2015  Admitting Physician: Alveda Reasons, MD  Primary Care Provider: No primary care provider on file. Consultants: None   Indication for Hospitalization: DKA in the setting of New Onset Diabetes   Discharge Diagnoses/Problem List:  Patient Active Problem List   Diagnosis Date Noted  . DKA (diabetic ketoacidoses) ([place]) 05/04/2015  . Acute renal failure ([place])   . Diabetic ketoacidosis without coma associated with diabetes mellitus due to underlying condition ([place])   . Hyponatremia     Disposition: Home   Discharge Condition: Stable   Discharge Exam: General: Patient lying in bed in NAD Cardiovascular: RRR, no murmurs  Respiratory: CTAB, Abdomen: BS+, no tenderness, no distended  Extremities: no lower extremity edema   Brief Hospital Course:  Mark Miles is 50 y.o. with no pmhx presenting with a week or so of hiccups, found to be in DKA due new onset type 2 diabetes. Patient endorsed polyuria, polydipsia, and 20 lbs  weight loss in the past 1-2 weeks. On admission, CBG 900, anion gap 32, K 5.3, Na 124 on admission. ABG pH 7.2, bicarb 9.3. Patient was started on insulin drip with transition to subcutaneous insulin once AG was closed x 2. Patient with high insulin requirement, with titration of Lantus from 40 to 50 units along with meal time coverage at 12 units TID. Patient blood sugars on this regiments were in 150- 200s . Patient was noted to have  A1C of 12.5. His TSH was wnls. Patient was also initially noted to have AKI likely due to dehydration, this improved to wnl once patient was rehydrated. Patient received diabetic education. Patient had no insurance, nor any primary care provider, therefore patient was set up to come to [place]. After discussion with case management provide prescription for glucometer, strips and lancets for patient to obtain from [place]. Patient also recieved diabetic kit which included syringes before discharge. Per discussion with pharmacy, home regiment to include NPH 25 units BID to ensure compliance due to cost of insulin without insurance. Also started patient on a low dose of metformin with intention to titrate up. Patient consider ready for discharge.   Issues for Follow Up:  1. Increase Metformin 500 mg daily to BID  2. Please set up an appointment with pharmacist for insulin regiment  3. Discussed patient recording CBGs insure that patient is successful at monitoring blood sugars  4. Continue NPH 25 units BID   Significant Procedures: None   Significant Labs and Imaging:   Recent Labs Lab 05/04/15 1220  WBC 8.0  HGB 15.4  HCT 46.6  PLT 166    Recent Labs Lab 05/04/15 1220  05/04/15 2120  05/05/15 1920 05/06/15 0015 05/06/15 0707 05/06/15 1811 05/07/15 1242  NA 124*  < > 137  < > 129* 136 136 139 140  K 5.3*  < > 3.7  < > 3.7 3.3* 3.4* 3.5 3.2*  CL 88*  < > 110  < > 102 109 108 108 105  CO2 12*  < > 14*  < > 17* 19* 18* 23 26  GLUCOSE 909*  < > 226*  < > 444* 306* 346* 169* 265*  BUN 25*  < > 23*  < > _0 <5*  CREATININE 2.25*  < > 1.43*  < >  1.22 0.96 0.90 0.98 0.91  CALCIUM 9.2  < > 8.4*  < > 8.0* 8.3* 8.0* 8.6* 8.8*  MG  --   --  2.0  --   --   --   --   --   --   ALKPHOS 112  --   --   --   --   --   --   --   --   AST 16  --   --   --   --   --   --   --   --   ALT 22  --   --   --   --   --   --   --   --   ALBUMIN 4.1  --   --   --   --   --   --   --   --   < > = values in this interval not displayed.  Results for ELVA, BREAKER (MRN [id number]) as of 05/07/2015 15:47  Ref. Range 05/04/2015 23:48  Hemoglobin A1C Latest Ref Range: 4.8-5.6 % 12.5 (H)    Results/Tests Pending at Time of Discharge: None  Discharge Medications:    Medication  List    STOP taking these medications        ALEVE 220 MG tablet  Generic drug:  naproxen sodium      TAKE these medications        blood glucose meter kit and supplies Kit  Dispense based on patient and insurance preference. Use up to four times daily as directed. (FOR ICD-9 250.00, 250.01).     insulin NPH Human 100 UNIT/ML injection  Commonly known as:  HUMULIN N  Inject 0.25 mLs (25 Units total) into the skin 2 (two) times daily before a meal. Please take once before breakfast and once before supper     metFORMIN 500 MG tablet  Commonly known as:  GLUCOPHAGE  Take 1 tablet (500 mg total) by mouth daily after breakfast.       Discharge Instructions: Please refer to Patient Instructions section of EMR for full details.  Patient was counseled important signs and symptoms that should prompt return to medical care, changes in medications, dietary instructions, activity restrictions, and follow up appointments.   Follow-Up Appointments: Follow-up Information    Follow up with Beverlyn Roux, MD. Go on 05/09/2015.   Specialty:  Family Medicine   Why:  Hospital follow-up @ 11 AM    Contact information:   [place] [address] [phone]       Tonette Bihari, MD 05/07/2015, 2:32 PM PGY-1, [place]

## 2015-05-07 NOTE — Progress Notes (Signed)
Inpatient Diabetes Program Recommendations  AACE/ADA: New Consensus Statement on Inpatient Glycemic Control (2015)  Target Ranges:  Prepandial:   less than 140 mg/dL      Peak postprandial:   less than 180 mg/dL (1-2 hours)      Critically ill patients:  140 - 180 mg/dL   Review of Glycemic Control  Inpatient Diabetes Program Recommendations:  Insulin - Basal: Fasting glucose 225 mg/dl, please increase basal insulin again to 56 units. May want to give additional 6 units today x1 dose.  Thanks,  Christena DeemShannon Zamiyah Resendes RN, MSN, [facility] Inpatient Diabetes Coordinator Team Pager [phone] (8a-5p)

## 2015-05-07 NOTE — Progress Notes (Signed)
Lanelle Bal to be D/C'd Home per MD order.  Discussed prescriptions and follow up appointments with the patient. Prescriptions given to patient, medication list explained in detail. Pt verbalized understanding.    Medication List    STOP taking these medications        ALEVE 220 MG tablet  Generic drug:  naproxen sodium      TAKE these medications        blood glucose meter kit and supplies Kit  Dispense based on patient and insurance preference. Use up to four times daily as directed. (FOR ICD-9 250.00, 250.01).     insulin NPH Human 100 UNIT/ML injection  Commonly known as:  HUMULIN N  Inject 0.25 mLs (25 Units total) into the skin 2 (two) times daily before a meal. Please take once before breakfast and once before supper     metFORMIN 500 MG tablet  Commonly known as:  GLUCOPHAGE  Take 1 tablet (500 mg total) by mouth daily after breakfast.        Filed Vitals:   05/07/15 0754 05/07/15 1651  BP: 137/94 131/90  Pulse: 89 103  Temp: 98.2 F (36.8 C) 98.3 F (36.8 C)  Resp: 16 18    Skin clean, dry and intact without evidence of skin break down, no evidence of skin tears noted. IV catheter discontinued intact. Site without signs and symptoms of complications. Dressing and pressure applied. Pt denies pain at this time. No complaints noted.  An After Visit Summary was printed and given to the patient. Patient escorted via [place], and D/C home via private auto.  Carole Civil RN [facility] Phone [phone]

## 2015-05-07 NOTE — Discharge Instructions (Signed)
You were admitted for DKA. You were found to have new onset diabetes. I have provided prescription for Glucometer/blood glucose strips and lancets. Please use these to check blood sugars as you have been instructed 3 times daily (once in morning, midday and night). [facility] Please keep a record of these blood sugar levels for your follow up appointment. At your discharge, you will have to continue taking NPH, 25 units at breakfast and 25 units at night time.  I will also start you on another medication called metformin, please start taking this once daily.   Blood Glucose Monitoring, Adult Monitoring your blood glucose (also know as blood sugar) helps you to manage your diabetes. It also helps you and your health care provider monitor your diabetes and determine how well your treatment plan is working. WHY SHOULD YOU MONITOR YOUR BLOOD GLUCOSE?  It can help you understand how food, exercise, and medicine affect your blood glucose.  It allows you to know what your blood glucose is at any given moment. You can quickly tell if you are having low blood glucose (hypoglycemia) or high blood glucose (hyperglycemia).  It can help you and your health care provider know how to adjust your medicines.  It can help you understand how to manage an illness or adjust medicine for exercise. WHEN SHOULD YOU TEST? Your health care provider will help you decide how often you should check your blood glucose. This may depend on the type of diabetes you have, your diabetes control, or the types of medicines you are taking. Be sure to write down all of your blood glucose readings so that this information can be reviewed with your health care provider. See below for examples of testing times that your health care provider may suggest. Type 1 Diabetes  Test at least 2 times per day if your diabetes is well controlled, if you are using an insulin pump, or if you perform multiple daily injections.  If  your diabetes is not well controlled or if you are sick, you may need to test more often.  It is a good idea to also test:  Before every insulin injection.  Before and after exercise.  Between meals and 2 hours after a meal.  Occasionally between 2:00 a.m. and 3:00 a.m. Type 2 Diabetes  If you are taking insulin, test at least 2 times per day. However, it is best to test before every insulin injection.  If you take medicines by mouth (orally), test 2 times a day.  If you are on a controlled diet, test once a day.  If your diabetes is not well controlled or if you are sick, you may need to monitor more often. HOW TO MONITOR YOUR BLOOD GLUCOSE Supplies Needed  Blood glucose meter.  Test strips for your meter. Each meter has its own strips. You must use the strips that go with your own meter.  A pricking needle (lancet).  A device that holds the lancet (lancing device).  A journal or log book to write down your results. Procedure  Wash your hands with soap and water. Alcohol is not preferred.  Prick the side of your finger (not the tip) with the lancet.  Gently milk the finger until a small drop of blood appears.  Follow the instructions that come with your meter for inserting the test strip, applying blood to the strip, and using your blood glucose meter. Other Areas to Get Blood for Testing Some meters allow you  to use other areas of your body (other than your finger) to test your blood. These areas are called alternative sites. The most common alternative sites are:  The forearm.  The thigh.  The back area of the lower leg.  The palm of the hand. The blood flow in these areas is slower. Therefore, the blood glucose values you get may be delayed, and the numbers are different from what you would get from your fingers. Do not use alternative sites if you think you are having hypoglycemia. Your reading will not be accurate. Always use a finger if you are having  hypoglycemia. Also, if you cannot feel your lows (hypoglycemia unawareness), always use your fingers for your blood glucose checks. ADDITIONAL TIPS FOR GLUCOSE MONITORING  Do not reuse lancets.  Always carry your supplies with you.  All blood glucose meters have a 24-hour "hotline" number to call if you have questions or need help.  Adjust (calibrate) your blood glucose meter with a control solution after finishing a few boxes of strips. BLOOD GLUCOSE RECORD KEEPING It is a good idea to keep a daily record or log of your blood glucose readings. Most glucose meters, if not all, keep your glucose records stored in the meter. Some meters come with the ability to download your records to your home computer. Keeping a record of your blood glucose readings is especially helpful if you are wanting to look for patterns. Make notes to go along with the blood glucose readings because you might forget what happened at that exact time. Keeping good records helps you and your health care provider to work together to achieve good diabetes management.    This information is not intended to replace advice given to you by your health care provider. Make sure you discuss any questions you have with your health care provider.   Document Released: 12/24/2002 Document Revised: 01/11/2014 Document Reviewed: 05/15/2012 Elsevier Interactive Patient Education [employer]

## 2015-05-09 ENCOUNTER — Ambulatory Visit (INDEPENDENT_AMBULATORY_CARE_PROVIDER_SITE_OTHER): Payer: Self-pay | Admitting: Family Medicine

## 2015-05-09 ENCOUNTER — Encounter: Payer: Self-pay | Admitting: Family Medicine

## 2015-05-09 ENCOUNTER — Inpatient Hospital Stay: Payer: Self-pay | Admitting: Family Medicine

## 2015-05-09 VITALS — BP 140/82 | HR 64 | Temp 98.0°F | Ht 73.0 in | Wt 248.0 lb

## 2015-05-09 DIAGNOSIS — E118 Type 2 diabetes mellitus with unspecified complications: Secondary | ICD-10-CM

## 2015-05-09 DIAGNOSIS — E119 Type 2 diabetes mellitus without complications: Secondary | ICD-10-CM | POA: Insufficient documentation

## 2015-05-09 MED ORDER — INSULIN NPH (HUMAN) (ISOPHANE) 100 UNIT/ML ~~LOC~~ SUSP: 25.0000 [IU] | Freq: Two times a day (BID) | SUBCUTANEOUS | Status: DC

## 2015-05-09 NOTE — Patient Instructions (Addendum)
Your blood sugars are doing much better, keep up the great work.  - Continue 70/30  Insulin 25 units twice a day - Continue checking your blood sugar first thing in the morning prior to taking medications or eating.  - Also check your blood sugar 2 hours after your largest meal     Diabetes Mellitus and Food It is important for you to manage your blood sugar (glucose) level. Your blood glucose level can be greatly affected by what you eat. Eating healthier foods in the appropriate amounts throughout the day at about the same time each day will help you control your blood glucose level. It can also help slow or prevent worsening of your diabetes mellitus. Healthy eating may even help you improve the level of your blood pressure and reach or maintain a healthy weight.  General recommendations for healthful eating and cooking habits include:  Eating meals and snacks regularly. Avoid going long periods of time without eating to lose weight.  Eating a diet that consists mainly of plant-based foods, such as fruits, vegetables, nuts, legumes, and whole grains.  Using low-heat cooking methods, such as baking, instead of high-heat cooking methods, such as deep frying. Work with your dietitian to make sure you understand how to use the Nutrition Facts information on food labels. HOW CAN FOOD AFFECT ME? Carbohydrates Carbohydrates affect your blood glucose level more than any other type of food. Your dietitian will help you determine how many carbohydrates to eat at each meal and teach you how to count carbohydrates. Counting carbohydrates is important to keep your blood glucose at a healthy level, especially if you are using insulin or taking certain medicines for diabetes mellitus. Alcohol Alcohol can cause sudden decreases in blood glucose (hypoglycemia), especially if you use insulin or take certain medicines for diabetes mellitus. Hypoglycemia can be a life-threatening condition. Symptoms of  hypoglycemia (sleepiness, dizziness, and disorientation) are similar to symptoms of having too much alcohol.  If your health care provider has given you approval to drink alcohol, do so in moderation and use the following guidelines:  Women should not have more than one drink per day, and men should not have more than two drinks per day. One drink is equal to:  12 oz of beer.  5 oz of wine.  1 oz of hard liquor.  Do not drink on an empty stomach.  Keep yourself hydrated. Have water, diet soda, or unsweetened iced tea.  Regular soda, juice, and other mixers might contain a lot of carbohydrates and should be counted. WHAT FOODS ARE NOT RECOMMENDED? As you make food choices, it is important to remember that all foods are not the same. Some foods have fewer nutrients per serving than other foods, even though they might have the same number of calories or carbohydrates. It is difficult to get your body what it needs when you eat foods with fewer nutrients. Examples of foods that you should avoid that are high in calories and carbohydrates but low in nutrients include:  Trans fats (most processed foods list trans fats on the Nutrition Facts label).  Regular soda.  Juice.  Candy.  Sweets, such as cake, pie, doughnuts, and cookies.  Fried foods. WHAT FOODS CAN I EAT? Eat nutrient-rich foods, which will nourish your body and keep you healthy. The food you should eat also will depend on several factors, including:  The calories you need.  The medicines you take.  Your weight.  Your blood glucose level.  Your blood  pressure level.  Your cholesterol level. You should eat a variety of foods, including:  Protein.  Lean cuts of meat.  Proteins low in saturated fats, such as fish, egg whites, and beans. Avoid processed meats.  Fruits and vegetables.  Fruits and vegetables that may help control blood glucose levels, such as apples, mangoes, and yams.  Dairy products.  Choose  fat-free or low-fat dairy products, such as milk, yogurt, and cheese.  Grains, bread, pasta, and rice.  Choose whole grain products, such as multigrain bread, whole oats, and brown rice. These foods may help control blood pressure.  Fats.  Foods containing healthful fats, such as nuts, avocado, olive oil, canola oil, and fish. DOES EVERYONE WITH DIABETES MELLITUS HAVE THE SAME MEAL PLAN? Because every person with diabetes mellitus is different, there is not one meal plan that works for everyone. It is very important that you meet with a dietitian who will help you create a meal plan that is just right for you.   This information is not intended to replace advice given to you by your health care provider. Make sure you discuss any questions you have with your health care provider.   Document Released: 09/17/2004 Document Revised: 01/11/2014 Document Reviewed: 11/17/2012 Elsevier Interactive Patient Education [employer] Else[employer] Inc[employer]

## 2015-05-09 NOTE — Progress Notes (Signed)
   Subjective:    Patient ID: Mark HeckDavid Njoku, male    DOB: 1965-12-21, 50 y.o.   MRN: [id number]  Seen for Hospital follow-up   CC: New diabetes and recent DKA  He reports he has been doing well since discharge.  Blood sugars running 100-300.  His uncompliant with 70/30 - 25 units BID.  He continues to endorse some polyuria, polydipsia.  Denies chest pain, shortness of breath.  Has appointment with match next Friday.  Started metformin 500 mg 2 days ago and has been tolerating this well.   Review of Systems   See HPI for ROS. Objective:  BP 140/82 mmHg  Pulse 64  Temp(Src) 98 F (36.7 C) (Oral)  Ht 6\' 1"  (1.854 m)  Wt 248 lb (112.492 kg)  BMI 32.73 kg/m2  General: NAD Cardiac: RRR, normal heart sounds, no murmurs. 2+ radial and PT pulses bilaterally Respiratory: CTAB, normal effort    Assessment & Plan:   DM2 (diabetes mellitus, type 2) (HCC) Doing well.  Blood sugars under better control.  - Continue NPH 25 u BID - Continue metformin 500 mg daily. Advised to titrate to 500 mg twice a day after 1 week.  - Appointment with Dr. Raymondo BandKoval in one week - Consider start ASA and Lipitor

## 2015-05-09 NOTE — Assessment & Plan Note (Signed)
Doing well.  Blood sugars under better control.  - Continue NPH 25 u BID - Continue metformin 500 mg daily. Advised to titrate to 500 mg twice a day after 1 week.  - Appointment with Dr. Raymondo BandKoval in one week - Consider start ASA and Lipitor

## 2015-05-14 ENCOUNTER — Inpatient Hospital Stay: Payer: Self-pay | Admitting: Internal Medicine

## 2015-05-15 ENCOUNTER — Encounter: Payer: Self-pay | Admitting: Pharmacist

## 2015-05-15 ENCOUNTER — Ambulatory Visit (INDEPENDENT_AMBULATORY_CARE_PROVIDER_SITE_OTHER): Payer: Self-pay | Admitting: Pharmacist

## 2015-05-15 DIAGNOSIS — E119 Type 2 diabetes mellitus without complications: Secondary | ICD-10-CM

## 2015-05-15 MED ORDER — INSULIN GLARGINE 100 UNIT/ML ~~LOC~~ SOLN: 56.0000 [IU] | SUBCUTANEOUS | Status: DC

## 2015-05-15 NOTE — Progress Notes (Signed)
Patient ID: Mark Miles, male   DOB: 08/01/1965, 49 y.o.   MRN: [id number] Reviewed: Agree with Dr. Koval's documentation and management. 

## 2015-05-15 NOTE — Patient Instructions (Signed)
We are glad that you came in today! It was very nice to meet you.  Increase metformin from 500mg  daily to 500mg  twice daily.  Take NPH insulin tonight, take 35 units. Then stop this medication.  Starting tomorrow, inject 56 units of Lantus in the morning.  Increase your Lantus dose by 2 units every day if your blood sugar is above 100.  If your blood sugar is < 70 eat a small serving of sugar (a fruity drink, half of a candy bar). Recheck your blood sugar after this to make sure that it has increased.  Follow up with Dr. Raymondo BandKoval in 2 weeks

## 2015-05-15 NOTE — Assessment & Plan Note (Signed)
Diabetes newly diagnosed currently improved but not at goal. Patient denies hypoglycemic events and is able to verbalize appropriate hypoglycemia management plan. Patient reports adherence with medication. Control is suboptimal due to optimization of medications.  Increased dose of insulin NPH to 35 units tonight then instructed patient to stop using NPH.  Will initiate basal insulin glargine (Lantus) 56 units daily tomorrow.  Patient to increase metformin to 500 mg BID starting today.  Patient will continue to titrate 2 units/day if fasting CBGs > 100mg /dl until fasting CBGs reach goal or next visit.  Provided extensive conuseling regarding diabetic diet and management of hypoglycemia.  Patient reports he is committed to increasing exercise and is interested in walking more.

## 2015-05-15 NOTE — Progress Notes (Signed)
S:    Patient arrives in good spirits.  Presents with his wife.  Presents for diabetes evaluation, education, and management at the request of Dr. Gwendolyn GrantWalden.  Patient was referred on 05/07/15.  Patient was last seen by Primary Care Provider on 05/09/15.   Medicaid application pending - still without insurance.  Diabetes was diagnosed on 05/04/15.   Patient reports adherence with medications.  Current diabetes medications include: insulin NPH 25 units BID, metformin 500 mg daily (with plans to titrate to 500 mg BID today as instructed)  Current hypertension medications include: none  Patient denies hypoglycemic events.  Patient reported dietary habits: Eats 3 meals/day Breakfast:grits, [place] bacon Lunch: salad, chicken, or soup  Dinner: chicken (mostly baked; occasional fried, mashed potatoes, reduced bread to 1 piece   Snacks: tortilla chips during the day; used to eat cake and cookies at night prior to diagnosis; is now eating fruit at night  Drinks: coffee with splenda, diet coke, tea (unsweet now), water (3-4 bottles a day) * Used to drink regular soda (6 pack a day), sweet tea and coffee with 4-5 packs of sugar.  Has switched to Splenda. * Has been tracking food that increase his blood sugar and has noticed baked potato and bread increase his sugar.    Patient reported exercise habits: does not currently exercise    Patient reports nocturia (reports 3-4 times per night).  Patient reports neuropathy. Patient reports visual changes (vision has improved since starting therapy). Patient denies self foot exams.  Discussed importance of daily foot exams.     O:  Lab Results  Component Value Date   HGBA1C 12.5* 05/04/2015   There were no vitals filed for this visit.  Home fasting CBG: 200-300s  10 year ASCVD risk: 15%.  A/P: Diabetes newly diagnosed currently improved but not at goal. Patient denies hypoglycemic events and is able to verbalize appropriate hypoglycemia management plan.  Patient reports adherence with medication. Control is suboptimal due to optimization of medications.  Increased dose of insulin NPH to 35 units tonight then instructed patient to stop using NPH.  Will initiate basal insulin glargine (Lantus) 56 units daily tomorrow.  Patient to increase metformin to 500 mg BID starting today.  Patient will continue to titrate 2 units/day if fasting CBGs > 100mg /dl until fasting CBGs reach goal or next visit.  Provided extensive conuseling regarding diabetic diet and management of hypoglycemia.  Patient reports he is committed to increasing exercise and is interested in walking more.  Next A1C anticipated in 3 months.  Reevaluate collecting MA/creatine at next visit after improvement in blood glucose.    ASCVD risk greater than 7.5%.  Reevaluate initiation of aspirin and statin at next visit once patient has heard about the status of his medicaid application.    Written patient instructions provided.  Total time in face to face counseling 40 minutes.  Follow up in Pharmacist Clinic Visit in 2 weeks.   Patient seen with Genia Hotterachel Kim, PharmD Candidate, Leone HavenAubrey Jones, PharmD Resident and Lilla Shookachel Henderson, PharmD Resident.

## 2015-05-16 ENCOUNTER — Ambulatory Visit: Payer: Self-pay | Attending: Internal Medicine

## 2015-05-16 LAB — GLUCOSE, POCT (MANUAL RESULT ENTRY): POC GLUCOSE: 226 mg/dL — AB (ref 70–99)

## 2015-05-17 LAB — MICROALBUMIN / CREATININE URINE RATIO
CREATININE, URINE: 123 mg/dL (ref 20–370)
MICROALB UR: 0.3 mg/dL
Microalb Creat Ratio: 2 mcg/mg creat (ref <30)

## 2015-05-20 ENCOUNTER — Telehealth: Payer: Self-pay | Admitting: *Deleted

## 2015-05-20 NOTE — Telephone Encounter (Signed)
-----   Message from Dessa PhiJosalyn Funches, MD sent at 05/19/2015  9:34 AM EDT ----- Negative urine microalbumin

## 2015-05-20 NOTE — Telephone Encounter (Signed)
Date of birth verified by pt  Negative micro albumin results given  Pt verbalized understanding

## 2015-05-22 ENCOUNTER — Telehealth: Payer: Self-pay | Admitting: Pharmacist

## 2015-05-22 ENCOUNTER — Telehealth: Payer: Self-pay | Admitting: Internal Medicine

## 2015-05-22 NOTE — Telephone Encounter (Signed)
Patient called office concerned that his vision is blurry and reports he is having  difficulty reading the small words on the TV.   He also report fasting CBGs or 105-180 in the AM - He has increased his Lantus to 68 units daily.   Advised that hyperglycemia can can vision changes that slowly resolve even with improved glycemic control this may take a few days to weeks.   He verbalized understanding that if vision worsened he should request additional workup but as long as his vision is gradually improving we should continue to follow CBGs and anticipate improvements in vision with time.   Discussed increasing insulin (Lantus dose) until he sees AM fasting ~ 100 - follow up in 1 week - Rx Clnic.

## 2015-05-29 ENCOUNTER — Encounter: Payer: Self-pay | Admitting: Pharmacist

## 2015-05-29 ENCOUNTER — Ambulatory Visit (INDEPENDENT_AMBULATORY_CARE_PROVIDER_SITE_OTHER): Payer: Self-pay | Admitting: Pharmacist

## 2015-05-29 DIAGNOSIS — E119 Type 2 diabetes mellitus without complications: Secondary | ICD-10-CM

## 2015-05-29 MED ORDER — INSULIN GLARGINE 100 UNIT/ML ~~LOC~~ SOLN: 80.0000 [IU] | SUBCUTANEOUS | Status: DC

## 2015-05-29 MED ORDER — METFORMIN HCL 1000 MG PO TABS: 1000.0000 mg | ORAL_TABLET | Freq: Two times a day (BID) | ORAL | Status: DC

## 2015-05-29 NOTE — Progress Notes (Signed)
   S:    Patient arrives accompanied by his wife, ambulates without assistance and is happy and interactive.  Presents for diabetes evaluation, education, and management at the request of Dr. Cathlean CowerMikell. Patient reports Diabetes was diagnosed with Diabetes last month. Patient filed for medicaid two months ago, but have not heard back    Patient reports adherence with medications.  Current diabetes medications include: Lantus 80 units daily and metformin 500 mg twice daily.  Current hypertension medications include: None  Patient denies hypoglycemic events.  Patient reported dietary habits: Eats 3 meals/day   Patient reports nocturia at only once per day.  Patient reports neuropathy which we will continue to follow at this time.  Patient reports visual changes. Discussed these changes should improve gradually and also discussed need for annual eye exam.  Patient educated on self foot exams. Verbalized understanding.   O:  Lab Results  Component Value Date   HGBA1C 12.5* 05/04/2015   Filed Vitals:   05/29/15 1611 05/29/15 1612  BP: 158/101 151/101  Pulse: 79 82   Home fasting CBG: 100s  10 year ASCVD risk: 15%.   A/P: Diabetes newly diagnosed currently significantly improved.  Patient denies hypoglycemic events and is able to verbalize appropriate hypoglycemia management plan. Patient reports adherence with medication.  Continued basal insulin Lantus (insulin glargine) 80 units.  Increase metformin to 1000 mg BID.  Next A1c in 3 months.      ASCVD risk greater than 7.5%. Reevaluate initiation of aspirin and statin at next visit.   Elevated blood pressure repeated and found to be highest readings since hospitalization.  Patient is not currently taking any medications for blood pressure.  Control is suboptimal due to high sodium diet and never been prescribed with antihypertensives. Suggested patient to reduce salt in his diet. Consider initiating antihypertensive during follow-up visit  if BP is above goal.   Written patient instructions provided.  Total time in face to face counseling 30 minutes.   Follow up in Pharmacist Clinic Visit with Dr. Cathlean CowerMikell in 3 weeks.   Patient seen with Genia Hotterachel Kim, PharmD Candidate and Lilla Shookachel Henderson, PharmD Resident.

## 2015-05-29 NOTE — Assessment & Plan Note (Signed)
Diabetes newly diagnosed currently significantly improved.  Patient denies hypoglycemic events and is able to verbalize appropriate hypoglycemia management plan. Patient reports adherence with medication.  Continued basal insulin Lantus (insulin glargine) 80 units.  Increase metformin to 1000 mg BID.  Next A1c in 3 months.

## 2015-05-29 NOTE — Patient Instructions (Signed)
Thank you for coming in today!  Continue Lantus 80 units once a day.    Try to reduce the salt in your diet.   Follow up with Dr. Cathlean CowerMikell in 3 weeks.

## 2015-05-30 NOTE — Progress Notes (Signed)
Patient ID: Mark HeckDavid Miles, male   DOB: Oct 05, 1965, 50 y.o.   MRN: [id number] Reviewed: Agree with Dr. Macky LowerKoval's documentation and management.

## 2015-06-17 ENCOUNTER — Ambulatory Visit (INDEPENDENT_AMBULATORY_CARE_PROVIDER_SITE_OTHER): Payer: Self-pay | Admitting: Internal Medicine

## 2015-06-17 VITALS — BP 137/91 | HR 89 | Temp 97.8°F | Ht 73.0 in | Wt 248.0 lb

## 2015-06-17 DIAGNOSIS — E119 Type 2 diabetes mellitus without complications: Secondary | ICD-10-CM

## 2015-06-17 DIAGNOSIS — K458 Other specified abdominal hernia without obstruction or gangrene: Secondary | ICD-10-CM

## 2015-06-17 MED ORDER — ASPIRIN EC 81 MG PO TBEC: 81.0000 mg | DELAYED_RELEASE_TABLET | Freq: Every day | ORAL | Status: DC

## 2015-06-17 NOTE — Assessment & Plan Note (Addendum)
New Diabetic - recently seen by Dr. Raymondo BandKoval on 5/25 - current regiment includes Lantus 80 mg daily and Metformin 1000 mg BID. CBGs are between 81-127 in the AM. Patient denies any hx of hypoglycemia. Microabuminuria 0.3, therefore not requiring an ACE at this time as no hx of HTN. Could consider in the future. - Referral opthamology  - Performed Foot exam, no peripheral neuropathy  - Lipid panel today, consider adding statin based ASCVD risk  - Will start 81 mg of aspirin  - Continue Lantus and metformin  - Refilled samples of needles
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
## 2015-06-17 NOTE — Patient Instructions (Addendum)
Please follow up in 3-4 weeks. Will need to apply for orange card. Please let us know if you need Lantus if you run out before next appointment

## 2015-06-18 ENCOUNTER — Encounter: Payer: Self-pay | Admitting: Internal Medicine

## 2015-06-18 DIAGNOSIS — K469 Unspecified abdominal hernia without obstruction or gangrene: Secondary | ICD-10-CM | POA: Insufficient documentation
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
## 2015-06-25 ENCOUNTER — Other Ambulatory Visit: Payer: Self-pay

## 2015-06-25 LAB — LIPID PANEL
CHOL/HDL RATIO: 6.5 ratio — AB (ref <=5.0)
Cholesterol: 207 mg/dL — ABNORMAL HIGH (ref 125–200)
HDL: 32 mg/dL — AB (ref >=40)
LDL CALC: 144 mg/dL — AB (ref <130)
TRIGLYCERIDES: 155 mg/dL — AB (ref <150)
VLDL: 31 mg/dL — ABNORMAL HIGH (ref <30)

## 2015-07-09 ENCOUNTER — Ambulatory Visit (INDEPENDENT_AMBULATORY_CARE_PROVIDER_SITE_OTHER): Payer: Self-pay | Admitting: Internal Medicine

## 2015-07-09 ENCOUNTER — Encounter: Payer: Self-pay | Admitting: Internal Medicine

## 2015-07-09 VITALS — BP 150/98 | HR 94 | Temp 98.2°F | Ht 73.0 in | Wt 251.0 lb

## 2015-07-09 DIAGNOSIS — K458 Other specified abdominal hernia without obstruction or gangrene: Secondary | ICD-10-CM

## 2015-07-09 DIAGNOSIS — E119 Type 2 diabetes mellitus without complications: Secondary | ICD-10-CM

## 2015-07-09 DIAGNOSIS — E785 Hyperlipidemia, unspecified: Secondary | ICD-10-CM

## 2015-07-09 DIAGNOSIS — I10 Essential (primary) hypertension: Secondary | ICD-10-CM

## 2015-07-09 MED ORDER — LOVASTATIN 40 MG PO TABS: 80.0000 mg | ORAL_TABLET | Freq: Every day | ORAL | Status: DC

## 2015-07-09 MED ORDER — LOVASTATIN 40 MG PO TABS
80.0000 mg | ORAL_TABLET | Freq: Every day | ORAL | Status: DC
Start: 2015-07-09 — End: 2015-07-10

## 2015-07-09 MED ORDER — LISINOPRIL 10 MG PO TABS: 10.0000 mg | ORAL_TABLET | Freq: Every day | ORAL | Status: DC

## 2015-07-09 MED ORDER — INSULIN DETEMIR 100 UNIT/ML ~~LOC~~ SOLN: 80.0000 [IU] | SUBCUTANEOUS | Status: DC

## 2015-07-09 NOTE — Progress Notes (Signed)
[facility] Progress Note  Subjective:  Mark HeckDavid Miles is a 50-yo male who was diagnosed with diabetes in May 2017 with hospitalization for DKA. He is here for follow-up.  T2DM: - Patient taking 80 units of lantus every morning - Taking metformin 1000 mg BID with diarrhea in the evenings. He says diarrhea is not daily. He does take the medication with meals.  - Checking sugars daily, usually in the morning. Log showed CBGs from 81-110. Entries for all of June.  - Denies symptoms of hypoglycemia (dizziness, sweating, light-headedness) and says he knows to take a snack if he were to have those symptoms - Is applying for Medicaid and MAP but neither are active yet; requesting another sample of lantus until he gets coverage. Has about 20 days of medication left.  - Blurry vision has improved since starting treatment for diabetes - Has been working on portion control and is becoming more aware of starches in his diet like potatoes ROS: tingling in his feet, no abdominal pain   HTN: - Patient has been treated for high blood pressure in the past  - Previously had been on lisinopril  ROS: no chest pain, occasional HAs   HLD: - ASCVD risk of 15% - Patient takes daily aspirin  Abdominal Hernia: - Patient was referred to general surgery at last OV - Was told he needs to lose weight before he can have surgical repair - Denies pain   Social: Never smoker  No Known Allergies  Objective: Blood pressure 145/90, pulse 94, temperature 98.2 F (36.8 C), temperature source Oral, height 6\' 1"  (1.854 m), weight 251 lb (113.853 kg). Repeat BP 150/98.  Constitutional: Obese male, pleasant, in NAD Cardiovascular: RRR, S1, S2, no m/r/g.  Pulmonary/Chest: Effort normal and breath sounds normal. No respiratory distress.  Abdominal: Soft. +BS, soft, NT, ND, no rebound or guarding. Large abdominal hernia.  Vitals reviewed  T cholesterol 207 and LDL 144 on lipid panel 6/21 Microalb  creat ratio nml at 2 upon last check 05/16/15  Assessment/Plan: DM2 (diabetes mellitus, type 2) (HCC) - Improved control since last visit given CBG log. - Continue lantus 80 units every morning and metformin 1000 mg BID. No more samples of lantus available. Vial of levemir given with instructions to take 40 units in the morning and 40 in the evening when he runs out of lantus. - Due for hgb A1c re-check at the end of July.  - May benefit from gabapentin with report of tingling in his feet.  HTN (hypertension) - BP not currently at goal of 140/90 per JNC-8 guidelines - Has had 2 elevated BP measurements in office over the last 2 months - Will prescribe lisinopril 10 mg daily - Will need BMP recheck at next visit to monitor SCr and K after re-initiating lisinopril (last potassium measurement was below 3.5, so initial lab assessment can be delayed 3-4 weeks per [facility] guidelines)  HLD (hyperlipidemia) - ASCVD risk 15% - Prescribed lovastatin 20 mg, as it is the only medication on the [employer] $4 list - Switch to high intensity statin once patient has insurance coverage or medication assistnace  Abdominal hernia - Stable - To have repaired after weight loss   Follow-up in 3-4 weeks.  Dani GobbleHillary Eadie Repetto, MD [facility], PGY-2

## 2015-07-09 NOTE — Patient Instructions (Signed)
Mr. Susann GivensFranklin,  Please continue to take 80 units of lantus daily until you run out.  Then take levemir 40 units twice daily until you see us again.  Please make an appointment for the end of July or the beginning of August.  I have prescribed lisinopril and a cholesterol medication as well.  Best,  Dr. Sampson GoonFitzgerald

## 2015-07-10 ENCOUNTER — Telehealth: Payer: Self-pay | Admitting: *Deleted

## 2015-07-10 DIAGNOSIS — I10 Essential (primary) hypertension: Secondary | ICD-10-CM | POA: Insufficient documentation

## 2015-07-10 DIAGNOSIS — E785 Hyperlipidemia, unspecified: Secondary | ICD-10-CM | POA: Insufficient documentation

## 2015-07-10 MED ORDER — LOVASTATIN 20 MG PO TABS: 20.0000 mg | ORAL_TABLET | Freq: Every day | ORAL | Status: DC

## 2015-07-10 NOTE — Assessment & Plan Note (Signed)
-   Stable - To have repaired after weight loss

## 2015-07-10 NOTE — Assessment & Plan Note (Signed)
-   Improved control since last visit given CBG log. - Continue lantus 80 units every morning and metformin 1000 mg BID. No more samples of lantus available. Vial of levemir given with instructions to take 40 units in the morning and 40 in the evening when he runs out of lantus. - Due for hgb A1c re-check at the end of July.  - May benefit from gabapentin with report of tingling in his feet.

## 2015-07-10 NOTE — Assessment & Plan Note (Signed)
-   BP not currently at goal of 140/90 per JNC-8 guidelines - Has had 2 elevated BP measurements in office over the last 2 months - Will prescribe lisinopril 10 mg daily - Will need BMP recheck at next visit to monitor SCr and K after re-initiating lisinopril (last potassium measurement was below 3.5, so initial lab assessment can be delayed 3-4 weeks per [facility] guidelines)

## 2015-07-10 NOTE — Telephone Encounter (Signed)
Spoke with patient about cholesterol medication. Dose covered by [employer] under $4 list is 20 mg daily. Changed prescription. Would recommend switching to high intensity statin once patient is able to get coverage through [facility] or [employer].

## 2015-07-10 NOTE — Assessment & Plan Note (Signed)
-   ASCVD risk 15% - Prescribed lovastatin 20 mg, as it is the only medication on the [employer] $4 list - Switch to high intensity statin once patient has insurance coverage or medication assistnace

## 2015-07-10 NOTE — Telephone Encounter (Signed)
Pt calling saying that you called in the wrong medicine it was supposed to be the cheaper one. Please call pt he would like to talk to you. Sheilyn Boehlke Bruna PotterBlount, CMA

## 2015-08-01 ENCOUNTER — Other Ambulatory Visit: Payer: Self-pay | Admitting: Internal Medicine

## 2015-08-01 DIAGNOSIS — E119 Type 2 diabetes mellitus without complications: Secondary | ICD-10-CM

## 2015-08-01 MED ORDER — INSULIN DETEMIR 100 UNIT/ML FLEXPEN: 80.0000 [IU] | PEN_INJECTOR | Freq: Every morning | SUBCUTANEOUS | 0 refills | Status: DC

## 2015-08-01 NOTE — Telephone Encounter (Signed)
Patient stated he does not have enough Lantus to last until his next appointment which is next Friday.  Clinic is out of Lantus, Patient given Levemir Flex Pen #2 per verbal order Dr. Milly Jakob, Bronson Ing, RN

## 2015-08-01 NOTE — Telephone Encounter (Signed)
Pt called and said he is running low on his Lantus and doesn't have enough to get him through until his next appointment. Pt stated that Dr. Raymondo Band knows his situation and gives him a sample bottle. Please call and let him know when it is ready to pick up.[phone] ep

## 2015-08-07 ENCOUNTER — Ambulatory Visit: Payer: Self-pay | Admitting: Internal Medicine

## 2015-08-12 ENCOUNTER — Encounter: Payer: Self-pay | Admitting: Internal Medicine

## 2015-08-12 ENCOUNTER — Ambulatory Visit (INDEPENDENT_AMBULATORY_CARE_PROVIDER_SITE_OTHER): Payer: Self-pay | Admitting: Internal Medicine

## 2015-08-12 VITALS — BP 132/88 | HR 95 | Temp 98.2°F | Ht 73.0 in | Wt 256.0 lb

## 2015-08-12 DIAGNOSIS — E119 Type 2 diabetes mellitus without complications: Secondary | ICD-10-CM

## 2015-08-12 DIAGNOSIS — E785 Hyperlipidemia, unspecified: Secondary | ICD-10-CM

## 2015-08-12 DIAGNOSIS — I10 Essential (primary) hypertension: Secondary | ICD-10-CM

## 2015-08-12 LAB — POCT GLYCOSYLATED HEMOGLOBIN (HGB A1C): HEMOGLOBIN A1C: 6.3

## 2015-08-12 NOTE — Patient Instructions (Addendum)
Please go to the health department to get access to orange card and assistance with medications. Continue Lantus at 60 units in the morning. At next appointment bring in blood sugar charts. If your blood sugar running above 150, give me call. Come in for a lab visit either this week or next week. Follow up in 1 month for diabetes. If you need a second vial, call me.

## 2015-08-12 NOTE — Progress Notes (Signed)
   [facility] Noralee CharsAsiyah Kita Neace, MD Phone: [phone]  Reason For Visit: Diabetes and HTN  HYPERTENSION Disease Monitoring: Blood pressure range- no issues with blood pressure  Chest pain, palpitations- none      Dyspnea- none Medications: Lisinopril  Compliance- None Lightheadedness,Syncope- none Edema- none   DIABETES Disease Monitoring:  Blood Sugar ranges- CBG 90s Lowest 83. Highest 115.  Polyuria/phagia/dipsia- none     Visual problems- None Medications: Metformin 1000 mg BID; Levemir 40 in AM and 40 at night  Compliance-no issues, sometime diarrhea with metformin once in awhile Hypoglycemic symptoms- 1 episode, feeling jittery (back in May).    HYPERLIPIDEMIA Disease Monitoring: See symptoms for Hypertension Medications: Lovastatin   Past Medical History Reviewed problem list.  Medications- reviewed and updated No additions to family history Social history- Reviewed  Objective: BP 132/88 (BP Location: Left Arm, Patient Position: Sitting, Cuff Size: Normal)   Pulse 95   Temp 98.2 F (36.8 C) (Oral)   Ht 6\' 1"  (1.854 m)   Wt 256 lb (116.1 kg)   BMI 33.78 kg/m  Gen: NAD, alert, cooperative with exam CV: RRR, good S1/S2, no murmur, Resp: CTABL, no wheezes, non-labored  Assessment/Plan: See problem based a/p  DM2 (diabetes mellitus, type 2) (HCC) A1C 6.3, Concern that it is to tightly controlled, though patient denies any episodes of hypoglycemia  - Will decrease total insulin to 60 units from 80 units - Change back to Lantus  - Discussed patient getting orange card through the health department, voiced understand and plans to go this for his medications  - Follow up in 1 month  - Bring blood sugar chart with him   HTN (hypertension) Controlled  - Continue lisinopril 10 mg   HLD (hyperlipidemia) Controlled  - Continue lovastatin

## 2015-08-13 MED ORDER — ASPIRIN EC 81 MG PO TBEC
81.0000 mg | DELAYED_RELEASE_TABLET | Freq: Every day | ORAL | 12 refills | Status: AC
Start: 2015-08-13 — End: ?

## 2015-08-13 MED ORDER — LISINOPRIL 10 MG PO TABS: 10.0000 mg | ORAL_TABLET | Freq: Every day | ORAL | 3 refills | Status: DC

## 2015-08-13 MED ORDER — METFORMIN HCL 1000 MG PO TABS: 1000.0000 mg | ORAL_TABLET | Freq: Two times a day (BID) | ORAL | 3 refills | Status: DC

## 2015-08-13 MED ORDER — LOVASTATIN 20 MG PO TABS: 20.0000 mg | ORAL_TABLET | Freq: Every day | ORAL | 4 refills | Status: DC

## 2015-08-13 NOTE — Assessment & Plan Note (Signed)
A1C 6.3, Concern that it is to tightly controlled, though patient denies any episodes of hypoglycemia  - Will decrease total insulin to 60 units from 80 units - Change back to Lantus  - Discussed patient getting orange card through the health department, voiced understand and plans to go this for his medications  - Follow up in 1 month  - Bring blood sugar chart with him

## 2015-08-13 NOTE — Assessment & Plan Note (Signed)
Controlled. Continue lisinopril 10mg 

## 2015-08-13 NOTE — Assessment & Plan Note (Signed)
Controlled  - Continue lovastatin

## 2015-08-15 ENCOUNTER — Other Ambulatory Visit: Payer: Self-pay

## 2015-08-15 ENCOUNTER — Encounter: Payer: Self-pay | Admitting: Pharmacist

## 2015-08-15 DIAGNOSIS — E119 Type 2 diabetes mellitus without complications: Secondary | ICD-10-CM

## 2015-08-15 LAB — BASIC METABOLIC PANEL WITH GFR
BUN: 11 mg/dL (ref 7–25)
CHLORIDE: 102 mmol/L (ref 98–110)
CO2: 24 mmol/L (ref 20–31)
Calcium: 9.1 mg/dL (ref 8.6–10.3)
Creat: 1.08 mg/dL (ref 0.70–1.33)
GFR, Est African American: >89 mL/min (ref >=60)
GFR, Est Non African American: 79 mL/min (ref >=60)
GLUCOSE: 99 mg/dL (ref 65–99)
POTASSIUM: 3.8 mmol/L (ref 3.5–5.3)
Sodium: 137 mmol/L (ref 135–146)

## 2015-08-15 NOTE — Progress Notes (Signed)
Patient comes to the office requesting samples of  Lantus.  Medication Samples have been provided to the patient.  Drug name: Lantus     Strength: 100units/ml        Qty: 1 pen  LOT: [id number]  Exp.Date: 09/03/2017  Dosing instructions: 60 units once daily.  The patient has been instructed regarding the correct time, dose, and frequency of taking this medication, including desired effects and most common side effects.   Kathrin RuddyKoval, Zamyra Allensworth G 4:23 PM 08/15/2015   Patient continues to work with [place] MAP process to obtain supply.

## 2015-08-19 ENCOUNTER — Encounter: Payer: Self-pay | Admitting: Internal Medicine

## 2015-08-21 ENCOUNTER — Telehealth: Payer: Self-pay | Admitting: Pharmacist

## 2015-08-21 DIAGNOSIS — E119 Type 2 diabetes mellitus without complications: Secondary | ICD-10-CM

## 2015-08-21 MED ORDER — INSULIN DETEMIR 100 UNIT/ML ~~LOC~~ SOLN: 60.0000 [IU] | Freq: Every day | SUBCUTANEOUS | 0 refills | Status: DC

## 2015-08-21 MED ORDER — INSULIN GLARGINE 100 UNIT/ML ~~LOC~~ SOLN: 80.0000 [IU] | SUBCUTANEOUS | 0 refills | Status: DC

## 2015-08-21 NOTE — Telephone Encounter (Signed)
Called to make Marylene Landngela aware of available samples for Mark Miles (patient) by Carole Binninghristine Chong, PharmD candidate.   Verbalized she will come later today.   Medication Samples have been provided to the patient.  Drug name: Lantus Solostar       Strength: 100units/ml        Qty: 3  LOT: [id number]  Exp.Date: 03/04/2018  Dosing instructions: 60 units daily.    The patient has been instructed regarding the correct time, dose, and frequency of taking this medication, including desired effects and most common side effects.   Kathrin RuddyKoval, Peter G 9:28 AM 08/21/2015   Placed in refrigerator for pick-up.

## 2015-08-22 ENCOUNTER — Ambulatory Visit: Payer: Self-pay | Attending: Internal Medicine

## 2015-09-18 ENCOUNTER — Ambulatory Visit (INDEPENDENT_AMBULATORY_CARE_PROVIDER_SITE_OTHER): Payer: Self-pay | Admitting: Internal Medicine

## 2015-09-18 ENCOUNTER — Encounter: Payer: Self-pay | Admitting: Internal Medicine

## 2015-09-18 VITALS — BP 134/96 | HR 95 | Temp 98.2°F | Ht 73.0 in | Wt 259.0 lb

## 2015-09-18 DIAGNOSIS — M545 Low back pain, unspecified: Secondary | ICD-10-CM

## 2015-09-18 DIAGNOSIS — M549 Dorsalgia, unspecified: Secondary | ICD-10-CM | POA: Insufficient documentation

## 2015-09-18 DIAGNOSIS — E119 Type 2 diabetes mellitus without complications: Secondary | ICD-10-CM

## 2015-09-18 MED ORDER — MELOXICAM 15 MG PO TABS: 15.0000 mg | ORAL_TABLET | Freq: Every day | ORAL | 0 refills | Status: DC

## 2015-09-18 NOTE — Patient Instructions (Addendum)
I have prescribed to Mobic for your back pain and leg pain. This may help the pain somewhat. You can ALSO try these back exercises that are shown below. For your diabetes I want you to decrease the total dose of insulin that you are taking from 60 units to 50 units. That means taking Levemir 25 units twice daily. Please follow-up with the health department to see about getting your insulin supply as soon as possible. Please follow-up with me in in one month.   Back Exercises The following exercises strengthen the muscles that help to support the back. They also help to keep the lower back flexible. Doing these exercises can help to prevent back pain or lessen existing pain. If you have back pain or discomfort, try doing these exercises 2-3 times each day or as told by your health care provider. When the pain goes away, do them once each day, but increase the number of times that you repeat the steps for each exercise (do more repetitions). If you do not have back pain or discomfort, do these exercises once each day or as told by your health care provider. EXERCISES Single Knee to Chest Repeat these steps 3-5 times for each leg: 1. Lie on your back on a firm bed or the floor with your legs extended. 2. Bring one knee to your chest. Your other leg should stay extended and in contact with the floor. 3. Hold your knee in place by grabbing your knee or thigh. 4. Pull on your knee until you feel a gentle stretch in your lower back. 5. Hold the stretch for 10-30 seconds. 6. Slowly release and straighten your leg. Pelvic Tilt Repeat these steps 5-10 times: 1. Lie on your back on a firm bed or the floor with your legs extended. 2. Bend your knees so they are pointing toward the ceiling and your feet are flat on the floor. 3. Tighten your lower abdominal muscles to press your lower back against the floor. This motion will tilt your pelvis so your tailbone points up toward the ceiling instead of pointing to  your feet or the floor. 4. With gentle tension and even breathing, hold this position for 5-10 seconds. Cat-Cow Repeat these steps until your lower back becomes more flexible: 1. Get into a hands-and-knees position on a firm surface. Keep your hands under your shoulders, and keep your knees under your hips. You may place padding under your knees for comfort. 2. Let your head hang down, and point your tailbone toward the floor so your lower back becomes rounded like the back of a cat. 3. Hold this position for 5 seconds. 4. Slowly lift your head and point your tailbone up toward the ceiling so your back forms a sagging arch like the back of a cow. 5. Hold this position for 5 seconds. Press-Ups Repeat these steps 5-10 times: 1. Lie on your abdomen (face-down) on the floor. 2. Place your palms near your head, about shoulder-width apart. 3. While you keep your back as relaxed as possible and keep your hips on the floor, slowly straighten your arms to raise the top half of your body and lift your shoulders. Do not use your back muscles to raise your upper torso. You may adjust the placement of your hands to make yourself more comfortable. 4. Hold this position for 5 seconds while you keep your back relaxed. 5. Slowly return to lying flat on the floor. Bridges Repeat these steps 10 times: 1. Lie on your  back on a firm surface. 2. Bend your knees so they are pointing toward the ceiling and your feet are flat on the floor. 3. Tighten your buttocks muscles and lift your buttocks off of the floor until your waist is at almost the same height as your knees. You should feel the muscles working in your buttocks and the back of your thighs. If you do not feel these muscles, slide your feet 1-2 inches farther away from your buttocks. 4. Hold this position for 3-5 seconds. 5. Slowly lower your hips to the starting position, and allow your buttocks muscles to relax completely. If this exercise is too easy, try  doing it with your arms crossed over your chest. Abdominal Crunches Repeat these steps 5-10 times: 1. Lie on your back on a firm bed or the floor with your legs extended. 2. Bend your knees so they are pointing toward the ceiling and your feet are flat on the floor. 3. Cross your arms over your chest. 4. Tip your chin slightly toward your chest without bending your neck. 5. Tighten your abdominal muscles and slowly raise your trunk (torso) high enough to lift your shoulder blades a tiny bit off of the floor. Avoid raising your torso higher than that, because it can put too much stress on your low back and it does not help to strengthen your abdominal muscles. 6. Slowly return to your starting position. Back Lifts Repeat these steps 5-10 times: 1. Lie on your abdomen (face-down) with your arms at your sides, and rest your forehead on the floor. 2. Tighten the muscles in your legs and your buttocks. 3. Slowly lift your chest off of the floor while you keep your hips pressed to the floor. Keep the back of your head in line with the curve in your back. Your eyes should be looking at the floor. 4. Hold this position for 3-5 seconds. 5. Slowly return to your starting position. SEEK MEDICAL CARE IF:  Your back pain or discomfort gets much worse when you do an exercise.  Your back pain or discomfort does not lessen within 2 hours after you exercise. If you have any of these problems, stop doing these exercises right away. Do not do them again unless your health care provider says that you can. SEEK IMMEDIATE MEDICAL CARE IF:  You develop sudden, severe back pain. If this happens, stop doing the exercises right away. Do not do them again unless your health care provider says that you can.   This information is not intended to replace advice given to you by your health care provider. Make sure you discuss any questions you have with your health care provider.   Document Released: 01/29/2004  Document Revised: 09/11/2014 Document Reviewed: 02/14/2014 Elsevier Interactive Patient Education [employer]

## 2015-09-18 NOTE — Progress Notes (Signed)
   [facility] Noralee CharsAsiyah Chyna Kneece, MD Phone: [phone]  Reason For Visit:  Follow up for diabetes    Back Pain  Patient with lower back pain on the right side. 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 Pain radiates to about the thigh. Has not taken any medication. Has a hx of chronic back pain for about two years. 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   DIABETES Issues:Recently decreased lantus from 80 units to units at previous visit due to tight control of A1C  Disease Monitoring: Blood Sugar ranges: AVG 95-100 Lowest 79 Highest 132 Polyuria/phagia/dipsia- None   Visual problems- None  Medications: Lantus 60 units once daily, Metformin 1000 mg BID  Compliance- no issues hypoglycemic symptoms-  Denies any episodes of palpations, weakness, diaphoresis,   Past Medical History Reviewed problem list.  Medications- reviewed and updated No additions to family history Social history- patient is a non smoker  Objective: BP (!) 134/96 (BP Location: Left Arm, Patient Position: Sitting, Cuff Size: Large)   Pulse 95   Temp 98.2 F (36.8 C) (Oral)   Ht 6\' 1"  (1.854 m)   Wt 259 lb (117.5 kg)   BMI 34.17 kg/m  Gen: NAD, alert, cooperative with exam HEENT: Normal Cardio: regular rate and rhythm, S1S2 heard, no murmurs appreciated Pulm: clear to auscultation bilaterally, no wheezes, rhonchi or rales  Assessment/Plan: See problem based a/p  Back pain Hx of Chronic Back Pain. No red flags. Now with new onset of lower back pain with pain radiating to mid-thigh. Patient with limited access to care due to lack of insurance. Discussed imaging and physical therapy with patient but he states he can't currently pay for this.  - Will provide patient with two weeks of mobic, provided exercises to patient - 1 month follow up   DM2  (diabetes mellitus, type 2) (HCC) Continue to titrate down insulin- from 60 to 50 units as patient requiring less insulin. Patient continues to need samples of Lantus and levemir from our clinic, though he has applied for both the orange card and the MAP program through health department  - Today provided patient with Levemir Pens as that was all that was available, instructed him to 25 units BID  - A1C at next visit  - Needs Flu - cheaper at pharmacy therefore recommended patient try to get shot there  - Pneumoccocal vaccine, pending orange card per patient  - ACE and Lipid/Aspirin for renal and cardiac protection

## 2015-09-22 NOTE — Assessment & Plan Note (Addendum)
Hx of Chronic Back Pain. No red flags. Now with new onset of lower back pain with pain radiating to mid-thigh. Patient with limited access to care due to lack of insurance. Discussed imaging and physical therapy with patient but he states he can't currently pay for this.  - Will provide patient with two weeks of mobic, provided exercises to patient - 1 month follow up

## 2015-09-22 NOTE — Assessment & Plan Note (Signed)
Continue to titrate down insulin- from 60 to 50 units as patient requiring less insulin. Patient continues to need samples of Lantus and levemir from our clinic, though he has applied for both the orange card and the MAP program through health department  - Today provided patient with Levemir Pens as that was all that was available, instructed him to 25 units BID  - A1C at next visit  - Needs Flu - cheaper at pharmacy therefore recommended patient try to get shot there  - Pneumoccocal vaccine, pending orange card per patient  - ACE and Lipid/Aspirin for renal and cardiac protection

## 2015-10-06 ENCOUNTER — Other Ambulatory Visit: Payer: Self-pay | Admitting: Internal Medicine

## 2015-10-06 DIAGNOSIS — I10 Essential (primary) hypertension: Secondary | ICD-10-CM

## 2015-10-20 ENCOUNTER — Ambulatory Visit (INDEPENDENT_AMBULATORY_CARE_PROVIDER_SITE_OTHER): Payer: Self-pay | Admitting: Internal Medicine

## 2015-10-20 VITALS — BP 152/98 | HR 110 | Temp 98.4°F | Ht 73.0 in | Wt 262.1 lb

## 2015-10-20 DIAGNOSIS — E119 Type 2 diabetes mellitus without complications: Secondary | ICD-10-CM

## 2015-10-20 DIAGNOSIS — Z23 Encounter for immunization: Secondary | ICD-10-CM

## 2015-10-20 DIAGNOSIS — E1142 Type 2 diabetes mellitus with diabetic polyneuropathy: Secondary | ICD-10-CM

## 2015-10-20 DIAGNOSIS — E1140 Type 2 diabetes mellitus with diabetic neuropathy, unspecified: Secondary | ICD-10-CM | POA: Insufficient documentation

## 2015-10-20 DIAGNOSIS — I10 Essential (primary) hypertension: Secondary | ICD-10-CM

## 2015-10-20 LAB — POCT GLYCOSYLATED HEMOGLOBIN (HGB A1C): Hemoglobin A1C: 5.6

## 2015-10-20 MED ORDER — GABAPENTIN 100 MG PO CAPS: 100.0000 mg | ORAL_CAPSULE | Freq: Three times a day (TID) | ORAL | 3 refills | Status: DC

## 2015-10-20 MED ORDER — INSULIN GLARGINE 100 UNIT/ML ~~LOC~~ SOLN: 30.0000 [IU] | SUBCUTANEOUS | 0 refills | Status: DC

## 2015-10-20 MED ORDER — LISINOPRIL 10 MG PO TABS: 20.0000 mg | ORAL_TABLET | Freq: Every day | ORAL | 3 refills | Status: DC

## 2015-10-20 NOTE — Patient Instructions (Addendum)
I want to to decrease your insulin to 30 units a day; Levemir take 15 units in the AM and 15 units in the pm. Continue taking your current regiment. I want you to increase your lisinopril to 20 mg daily to help control your blood pressure.. I am going to give you pain medication for the burning sensation your having in your right leg. Please take gabapentin 100 mg threes times a day.

## 2015-10-20 NOTE — Progress Notes (Signed)
   [facility] Noralee CharsAsiyah Mikell, MD Phone: [phone]  Reason For Visit:   # DIABETES Issues: None. Patient has been doing well take 50 units of insulin daily with issue. Pient has not seen health department about obtain medication assistance. However now has the orange card. Does not some diabetic neuropathy in right leg.   Disease Monitoring: Blood Sugar ranges- CBG 84- 110 Polyuria/phagia/dipsia- none    Visual problems- none Medications: Compliance- Good Hypoglycemic symptoms- No episodes concerning for hypoglycemia   CHRONIC HTN: Current Meds - taking lisinopril 10 mg currently, no issues   Reports good compliance, took meds today. Tolerating well, w/o complaints. Lifestyle - working at a [employer] so has not been eating well. Discussed the importance of a good diet.  Denies CP, dyspnea, HA, edema, dizziness / lightheadedness  Past Medical History Reviewed problem list.  Medications- reviewed and updated No additions to family history Social history- patient is a non-smoker  Objective: BP (!) 152/98 (BP Location: Left Arm, Patient Position: Sitting, Cuff Size: Normal)   Pulse (!) 110   Temp 98.4 F (36.9 C) (Oral)   Ht 6\' 1"  (1.854 m)   Wt 262 lb 1.6 oz (118.9 kg)   SpO2 99%   BMI 34.58 kg/m  Gen: NAD, alert, cooperative with exam Cardio: regular rate and rhythm, S1S2 heard, no murmurs appreciated Pulm: clear to auscultation bilaterally, no wheezes, rhonchi or rales Neuro: Strength and sensation grossly intact   Assessment/Plan: See problem based a/p  HTN (hypertension) Uncontrolled, will increase lisinopril to 20 mg. Follow up at next appointment in 1 month   DM2 (diabetes mellitus, type 2) (HCC) A1C 5.6, down from 6.4 about 2 months ago. Will decrease insulin from 50 to 30 units. Unsure of the patient's etiology of decreasing insulin requirement as weight has not trended down  - Levemir 15 units BID  - Metformin 1000 mg BID  -  Follow up in 1 month  - At that time referral to opthalmology as patient just received orange card in the last week.  - Next visit will be health maintenance visit     Diabetic neuropathy (HCC) Burning neuropathic pain in right thigh  - Gabapentin 100 mg TID, follow up for improvement

## 2015-10-22 NOTE — Assessment & Plan Note (Addendum)
A1C 5.6, down from 6.4 about 2 months ago. Will decrease insulin from 50 to 30 units. Unsure of the patient's etiology of decreasing insulin requirement as weight has not trended down  - Levemir 15 units BID  - Metformin 1000 mg BID  - Follow up in 1 month  - At that time referral to opthalmology as patient just received orange card in the last week.  - Next visit will be health maintenance visit

## 2015-10-22 NOTE — Assessment & Plan Note (Signed)
Uncontrolled, will increase lisinopril to 20 mg. Follow up at next appointment in 1 month

## 2015-10-22 NOTE — Assessment & Plan Note (Signed)
Burning neuropathic pain in right thigh  - Gabapentin 100 mg TID, follow up for improvement

## 2015-10-30 ENCOUNTER — Other Ambulatory Visit: Payer: Self-pay | Admitting: Internal Medicine

## 2015-10-30 DIAGNOSIS — M545 Low back pain, unspecified: Secondary | ICD-10-CM

## 2015-11-20 ENCOUNTER — Ambulatory Visit (INDEPENDENT_AMBULATORY_CARE_PROVIDER_SITE_OTHER): Payer: No Typology Code available for payment source | Admitting: Internal Medicine

## 2015-11-20 ENCOUNTER — Encounter: Payer: Self-pay | Admitting: Internal Medicine

## 2015-11-20 VITALS — BP 144/91 | HR 89 | Temp 98.1°F | Wt 255.4 lb

## 2015-11-20 DIAGNOSIS — Z23 Encounter for immunization: Secondary | ICD-10-CM

## 2015-11-20 DIAGNOSIS — E119 Type 2 diabetes mellitus without complications: Secondary | ICD-10-CM

## 2015-11-20 LAB — BASIC METABOLIC PANEL WITH GFR
BUN: 15 mg/dL (ref 7–25)
CALCIUM: 9.2 mg/dL (ref 8.6–10.3)
CO2: 26 mmol/L (ref 20–31)
Chloride: 103 mmol/L (ref 98–110)
Creat: 1.06 mg/dL (ref 0.70–1.33)
GFR, EST NON AFRICAN AMERICAN: 80 mL/min (ref >=60)
GLUCOSE: 78 mg/dL (ref 65–99)
POTASSIUM: 4 mmol/L (ref 3.5–5.3)
SODIUM: 138 mmol/L (ref 135–146)

## 2015-11-20 MED ORDER — INSULIN DETEMIR 100 UNIT/ML FLEXPEN: 5.0000 [IU] | PEN_INJECTOR | Freq: Two times a day (BID) | SUBCUTANEOUS | 11 refills | Status: DC

## 2015-11-20 MED ORDER — INSULIN GLARGINE 100 UNIT/ML ~~LOC~~ SOLN: 10.0000 [IU] | Freq: Every day | SUBCUTANEOUS | 11 refills | Status: DC

## 2015-11-20 NOTE — Progress Notes (Signed)
   [facility] Mark CharsAsiyah Mikell, MD Phone: [phone]  Reason For Visit: Diabetes follow up   # DIABETES Issues: None,  Disease Monitoring: Blood Sugar ranges- CBGs 88- 115, Avg 100 Polyuria/phagia/dipsia-none     Visual problems- has not seen a opthalmology yet -will send patient a referral for eye doctor Medications: Compliance-  Previously taking 30 units of insulin, most recently take 15 units of Levemir in the AM and 15 units in the evening, metformin  Hypoglycemic symptoms- patient denies any hypoglycemic episodes, no palpations, no weakness, fatigue, nausea or diaphoresis  - Patient is in the process of obtaining medication assistance    Past Medical History Reviewed problem list.  Medications- reviewed and updated No additions to family history Social history- patient is a non-smoker  Objective: BP (!) 144/91 (BP Location: Left Arm, Patient Position: Sitting, Cuff Size: Normal)   Pulse 89   Temp 98.1 F (36.7 C) (Oral)   Wt 255 lb 6.4 oz (115.8 kg)   BMI 33.70 kg/m  Gen: NAD, alert, cooperative with exam Skin: dry, intact, no rashes or lesions Neuro: Strength and sensation grossly intact  Assessment/Plan: See problem based a/p  DM2 (diabetes mellitus, type 2) (HCC) Last A1C 5.6, patient has a interesting history of being admitted with DKA. And requiring 8 units of Lantus at discharge. Since then he has continued to need less and less Lantus. Most recently I have decreased him from 30 units to 10 due to his A1c of 5.6. His weights have remained within normal range, he denies any improvement of his diet and states that because he works at a [employer] he tends to eat there. His kidney function is stable no CK D has been noted on his past or most recent bmet that was obtained at this visit. There was some discussion with Dr. Pollie MeyerMcIntyre about the possibility of an insulinoma, though this is extremely unlikely as this is an extremely rare  finding with symptoms of hypoglycemia usually noted by the patient. Discussed and reviewed with patient method of providing insulin, patient seemed to be educated on the process of insulin administration. - BASIC METABOLIC PANEL WITH GFR - Decreased insulin from 30 units total to 10 units total - 5 units of levemir in the AM and 5 units in the PM  - Continue metformin  - Return precautions given - Instructed patient to check blood sugars in the AM and PM  - Follow up in 1 month, A1C at that time  - Repeat foot exam and referral to opthalmology at next visit, pneumococcal vaccine as well

## 2015-11-20 NOTE — Patient Instructions (Addendum)
I want you to decrease your insulin down to 5 units of Levemir in the morning and 5 units of Levemir at night. If you have any low blood sugars as we discussed in clinic today, I want you to stop your insulin completely and call me to come in for appointment. Otherwise I want to see you back in 1 month for follow up appointment. Also please start checking your blood sugars at night, before dinner same as you do in the morning.   Hypoglycemia  Hypoglycemia is when the sugar (glucose) level in the blood is too low. Symptoms of low blood sugar may include:  Feeling:  Hungry.  Worried or nervous (anxious).  Sweaty and clammy.  Confused.  Dizzy.  Sleepy.  Sick to your stomach (nauseous).  Having:  A fast heartbeat.  A headache.  A change in your vision.  Jerky movements that you cannot control (seizure).  Nightmares.  Tingling or no feeling (numbness) around the mouth, lips, or tongue.  Having trouble with:  Talking.  Paying attention (concentrating).  Moving (coordination).  Sleeping.  Shaking.  Passing out (fainting).  Getting upset easily (irritability). Low blood sugar can happen to people who have diabetes and people who do not have diabetes. Low blood sugar can happen quickly, and it can be an emergency. Treating Low Blood Sugar  Low blood sugar is often treated by eating or drinking something sugary right away. If you can think clearly and swallow safely, follow the 15:15 rule:  Take 15 grams of a fast-acting carb (carbohydrate). Some fast-acting carbs are:  1 tube of glucose gel.  3 sugar tablets (glucose pills).  6-8 pieces of hard candy.  4 oz (120 mL) of fruit juice.  4 oz (120 mL) of regular (not diet) soda.  Check your blood sugar 15 minutes after you take the carb.  If your blood sugar is still at or below 70 mg/dL (3.9 mmol/L), take 15 grams of a carb again.  If your blood sugar does not go above 70 mg/dL (3.9 mmol/L) after 3 tries, get  help right away.  After your blood sugar goes back to normal, eat a meal or a snack within 1 hour. Treating Very Low Blood Sugar  If your blood sugar is at or below 54 mg/dL (3 mmol/L), you have very low blood sugar (severe hypoglycemia). This is an emergency. Do not wait to see if the symptoms will go away. Get medical help right away. Call your local emergency services (911 in the [place] Do not drive yourself to the hospital. If you have very low blood sugar and you cannot eat or drink, you may need a glucagon shot (injection). A family member or friend should learn how to check your blood sugar and how to give you a glucagon shot. Ask your doctor if you need to have a glucagon shot kit at home. Follow these instructions at home: General instructions  Avoid any diets that cause you to not eat enough food. Talk with your doctor before you start any new diet.  Take over-the-counter and prescription medicines only as told by your doctor.  Limit alcohol to no more than 1 drink per day for nonpregnant women and 2 drinks per day for men. One drink equals 12 oz of beer, 5 oz of wine, or 1 oz of hard liquor.  Keep all follow-up visits as told by your doctor. This is important. If You Have Diabetes:   Make sure you know the symptoms of low  blood sugar.  Always keep a source of sugar with you, such as:  Sugar.  Sugar tablets.  Glucose gel.  Fruit juice.  Regular soda (not diet soda).  Milk.  Hard candy.  Honey.  Take your medicines as told.  Follow your exercise and meal plan.  Eat on time. Do not skip meals.  Follow your sick day plan when you cannot eat or drink normally. Make this plan ahead of time with your doctor.  Check your blood sugar as often as told by your doctor. Always check before and after exercise.  Share your diabetes care plan with:  Your work or school.  People you live with.  Check your pee (urine) for ketones:  When you are sick.  As told by  your doctor.  Carry a card or wear jewelry that says you have diabetes. If You Have Low Blood Sugar From Other Causes:   Check your blood sugar as often as told by your doctor.  Follow instructions from your doctor about what you cannot eat or drink. Contact a doctor if:  You have trouble keeping your blood sugar in your target range.  You have low blood sugar often. Get help right away if:  You still have symptoms after you eat or drink something sugary.  Your blood sugar is at or below 54 mg/dL (3 mmol/L).  You have jerky movements that you cannot control.  You pass out. These symptoms may be an emergency. Do not wait to see if the symptoms will go away. Get medical help right away. Call your local emergency services (911 in the [place] Do not drive yourself to the hospital.  This information is not intended to replace advice given to you by your health care provider. Make sure you discuss any questions you have with your health care provider. Document Released: 03/17/2009 Document Revised: 05/29/2015 Document Reviewed: 01/24/2015 Elsevier Interactive Patient Education  2017 [employer].

## 2015-11-24 NOTE — Assessment & Plan Note (Addendum)
Last A1C 5.6, patient has a interesting history of being admitted with DKA. And requiring 8 units of Lantus at discharge. Since then he has continued to need less and less Lantus. Most recently I have decreased him from 30 units to 10 due to his A1c of 5.6. His weights have remained within normal range, he denies any improvement of his diet and states that because he works at a [employer] he tends to eat there. His kidney function is stable no CK D has been noted on his past or most recent bmet that was obtained at this visit. There was some discussion with Dr. Pollie MeyerMcIntyre about the possibility of an insulinoma, though this is extremely unlikely as this is an extremely rare finding with symptoms of hypoglycemia usually noted by the patient. Discussed and reviewed with patient method of providing insulin, patient seemed to be educated on the process of insulin administration. - BASIC METABOLIC PANEL WITH GFR - Decreased insulin from 30 units total to 10 units total - 5 units of levemir in the AM and 5 units in the PM  - Continue metformin  - Return precautions given - Instructed patient to check blood sugars in the AM and PM  - Follow up in 1 month, A1C at that time  - Repeat foot exam and referral to opthalmology at next visit, pneumococcal vaccine as well

## 2015-12-19 ENCOUNTER — Encounter: Payer: Self-pay | Admitting: Internal Medicine

## 2015-12-19 ENCOUNTER — Ambulatory Visit (INDEPENDENT_AMBULATORY_CARE_PROVIDER_SITE_OTHER): Payer: No Typology Code available for payment source | Admitting: Internal Medicine

## 2015-12-19 VITALS — BP 132/86 | HR 84 | Temp 98.2°F | Ht 73.0 in | Wt 260.6 lb

## 2015-12-19 DIAGNOSIS — I10 Essential (primary) hypertension: Secondary | ICD-10-CM

## 2015-12-19 DIAGNOSIS — E119 Type 2 diabetes mellitus without complications: Secondary | ICD-10-CM

## 2015-12-19 DIAGNOSIS — Z23 Encounter for immunization: Secondary | ICD-10-CM

## 2015-12-19 LAB — POCT GLYCOSYLATED HEMOGLOBIN (HGB A1C): HEMOGLOBIN A1C: 5.8

## 2015-12-19 NOTE — Progress Notes (Signed)
   [facility] Mark CharsAsiyah Mikell, MD Phone: [phone]  Reason For Visit: Diabetes follow up   #  CHRONIC DM, Type 2: Have been down titrating insulin regiment as patient has been requiring less and less insulin Reports no concerns CBGs: Avg 100, Low 98 High 104. Checks CBGs twice daily  Meds: Lantus 10 daily Reports good compliance. Tolerating well w/o side-effects Currently on ACEi / ARB  Lifestyle:  No diet or exercise changes, plans on exercising more  Any hypoglycemia episodes: Denies palpations, diaphoresis, fatigue, weakness, jittery Denies polyuria, visual changes, numbness or tingling.  HTN  - Blood pressure well controlled, no concerns  - No dizziness, headaches, syncope   Past Medical History Reviewed problem list.  Medications- reviewed and updated No additions to family history Social history- patient is a non-smoker  Objective: BP 132/86 (BP Location: Left Arm, Patient Position: Sitting, Cuff Size: Large)   Pulse 84   Temp 98.2 F (36.8 C) (Oral)   Ht 6\' 1"  (1.854 m)   Wt 260 lb 9.6 oz (118.2 kg)   SpO2 98%   BMI 34.38 kg/m  Gen: NAD, alert, cooperative with exam Cardio: regular rate and rhythm, S1S2 heard, no murmurs appreciated Pulm: clear to auscultation bilaterally, no wheezes, rhonchi or rales  Diabetic Foot Exam - Simple   Simple Foot Form Diabetic Foot exam was performed with the following findings:  Yes 12/19/2015  4:00 PM  Visual Inspection No deformities, no ulcerations, no other skin breakdown bilaterally:  Yes Sensation Testing Intact to touch and monofilament testing bilaterally:  Yes Pulse Check Posterior Tibialis and Dorsalis pulse intact bilaterally:  Yes Comments     Assessment/Plan: See problem based a/p  DM2 (diabetes mellitus, type 2) (HCC) Last A1C 5.8, currently on Lantus 10 units and Metformin 1000 mg BID  - Diabetic foot exam performed  - Will stop Lantus completely, return precautions given for  CBGs over 200  -  continue Metformin 1000 mg BID  - Follow up in 1 month  - Check CBGs twice daily  - Pneumococcal vaccine given today.    HTN (hypertension) Stable, well controlled  - Continue lisinopril 10 mg daily

## 2015-12-19 NOTE — Patient Instructions (Signed)
I am going to have you complete stop taking insulin. If you blood sugars are above 200, please call and schedule an appointment otherwise follow up in 1 month Continue taking your metformin. Please follow up with 1 month. Please check your blood sugars twice daily

## 2015-12-26 NOTE — Assessment & Plan Note (Signed)
Last A1C 5.8, currently on Lantus 10 units and Metformin 1000 mg BID  - Diabetic foot exam performed  - Will stop Lantus completely, return precautions given for CBGs over 200  -  continue Metformin 1000 mg BID  - Follow up in 1 month  - Check CBGs twice daily  - Pneumococcal vaccine given today.

## 2015-12-26 NOTE — Assessment & Plan Note (Addendum)
Stable, well controlled  - Continue lisinopril 10 mg daily

## 2016-01-19 ENCOUNTER — Ambulatory Visit (INDEPENDENT_AMBULATORY_CARE_PROVIDER_SITE_OTHER): Payer: No Typology Code available for payment source | Admitting: Internal Medicine

## 2016-01-19 ENCOUNTER — Encounter: Payer: Self-pay | Admitting: Internal Medicine

## 2016-01-19 VITALS — BP 136/88 | HR 84 | Temp 98.0°F | Ht 73.0 in | Wt 264.2 lb

## 2016-01-19 DIAGNOSIS — Z0100 Encounter for examination of eyes and vision without abnormal findings: Secondary | ICD-10-CM

## 2016-01-19 DIAGNOSIS — E119 Type 2 diabetes mellitus without complications: Secondary | ICD-10-CM

## 2016-01-19 NOTE — Assessment & Plan Note (Addendum)
-   Reported CBGs indicate good control on metformin - Advised patient he could check his blood sugars once daily instead of twice daily - Gave handout on low glycemic index foods. Discussed trying to have protein with carbs to avoid sugar spikes. Suggested bringing snacks to work to avoid eating so much fast food. - Recheck A1c in March to ensure good control after stopping lantus. Could space out checks thereafter if still < 7.  - Placed referral to Ophthalmology for eye exam

## 2016-01-19 NOTE — Patient Instructions (Signed)
Keep up the good work, Mr. Susann GivensFranklin!  Try to have protein with your starches as much as possible. Losing weight can help with blood sugar control.  Please return in March for blood sugar testing or sooner as needed.   Best, Dr. Sampson GoonFitzgerald    Tips for Eating Away From Home If You Have Diabetes Controlling your level of blood glucose, also known as blood sugar, can be challenging. It can be even more difficult when you do not prepare your own meals. The following tips can help you manage your diabetes when you eat away from home. Planning ahead Plan ahead if you know you will be eating away from home:  Ask your health care provider how to time meals and medicine if you are taking insulin.  Make a list of restaurants near you that offer healthy choices. If they have a carry-out menu, take it home and plan what you will order ahead of time.  Look up the restaurant you want to eat at online. Many chain and fast-food restaurants list nutritional information online. Use this information to choose the healthiest options and to calculate how many carbohydrates will be in your meal.  Use a carbohydrate-counting book or mobile app to look up the carbohydrate content and serving size of the foods you want to eat.  Become familiar with serving sizes and learn to recognize how many servings are in a portion. This will allow you to estimate how many carbohydrates you can eat. Free foods A "free food" is any food or drink that has less than 5 g of carbohydrates per serving. Free foods include:  Many vegetables.  Hard boiled eggs.  Nuts or seeds.  Olives.  Cheeses.  Meats. These types of foods make good appetizer choices and are often available at salad bars. Lemon juice, vinegar, or a low-calorie salad dressing of fewer than 20 calories per serving can be used as a "free" salad dressing. Choices to reduce carbohydrates  Substitute nonfat sweetened yogurt with a sugar-free yogurt. Yogurt  made from soy milk may also be used, but you will still want a sugar-free or plain option to choose a lower carbohydrate amount.  Ask your server to take away the bread basket or chips from your table.  Order fresh fruit. A salad bar often offers fresh fruit choices. Avoid canned fruit because it is usually packed in sugar or syrup.  Order a salad, and eat it without dressing. Or, create a "free" salad dressing.  Ask for substitutions. For example, instead of [place] fries, request an order of a vegetable such as salad, green beans, or broccoli. Other tips  If you take insulin, take the insulin once your food arrives to your table. This will ensure your insulin and food are timed correctly.  Ask your server about the portion size before your order, and ask for a take-out box if the portion has more servings than you should have. When your food comes, leave the amount you should have on the plate, and put the rest in the take-out box.  Consider splitting an entree with someone and ordering a side salad. This information is not intended to replace advice given to you by your health care provider. Make sure you discuss any questions you have with your health care provider. Document Released: 12/21/2004 Document Revised: 05/29/2015 Document Reviewed: 03/20/2013 Elsevier Interactive Patient Education  2017 [employer]

## 2016-01-19 NOTE — Progress Notes (Signed)
[facility] Progress Note  Subjective:  Leeam Cedrone is a 51 y.o. who presents to follow-up on T2DM.  #T2DM: - Taking only metformin 1000 mg BID. Lantus 10U daily stopped mid-December because A1c was only 5.8.  - Has been checking his CBGs twice daily. Left his log at home but says CBGs typically in range of 90s-120s. Had low of 69 and felt a little light-headed last week. Had a high of 226 after eating hot cakes and sausage.  - Has switched out regular soda for diet - Has met with nutritionist but has found it hard to make changes in his diet - Does work at [employer] and has trouble not eating fast food on the job - Likes to walk around his apartment complex at night and plans to start doing some calisthenics inside his home - Has not seen an eye doctor recently. Denies blurry vision but has had some watering of L eye when he goes out in the cold. - Has gained some weight he says due to increased portion sizes over holidays ROS: No increased urinary frequency, no abdominal pain/diarrhea  No Known Allergies   Social: Never smoker  Objective: Blood pressure 136/88, pulse 84, temperature 98 F (36.7 C), temperature source Oral, height '6\' 1"'  (1.854 m), weight 264 lb 3.2 oz (119.8 kg). Body mass index is 34.86 kg/m. Constitutional: Obese male, in NAD, very pleasant HENT: MMM.  Eyes: PERRLA, EOMI, no conjunctival injection Cardiovascular: RRR, S1, S2, no m/r/g.  Pulmonary/Chest: Effort normal and breath sounds normal. No respiratory distress.  Neurological: AOx3, no focal deficits. Visual acuity 20/25 bilaterally.  Psychiatric: Normal mood and affect.  Vitals reviewed  Assessment/Plan: DM2 (diabetes mellitus, type 2) ([place]) - Reported CBGs indicate good control on metformin - Advised patient he could check his blood sugars once daily instead of twice daily - Gave handout on low glycemic index foods. Discussed trying to have protein with carbs to avoid sugar spikes.  Suggested bringing snacks to work to avoid eating so much fast food. - Recheck A1c in March to ensure good control after stopping lantus. Could space out checks thereafter if still < 7.  - Placed referral to Ophthalmology for eye exam  Health Maintenance: Declined hepatitis C and HIV screening due to needing to leave. Had colonoscopy in 2017; will request records.   Follow-up in March for A1c recheck.  Olene Floss, MD [facility], PGY-2

## 2016-02-03 ENCOUNTER — Other Ambulatory Visit: Payer: Self-pay | Admitting: Family Medicine

## 2016-02-03 ENCOUNTER — Other Ambulatory Visit: Payer: Self-pay | Admitting: Internal Medicine

## 2016-02-03 DIAGNOSIS — E119 Type 2 diabetes mellitus without complications: Secondary | ICD-10-CM

## 2016-02-03 DIAGNOSIS — E785 Hyperlipidemia, unspecified: Secondary | ICD-10-CM

## 2016-02-09 ENCOUNTER — Telehealth: Payer: Self-pay

## 2016-02-09 DIAGNOSIS — I10 Essential (primary) hypertension: Secondary | ICD-10-CM

## 2016-02-09 MED ORDER — LISINOPRIL 10 MG PO TABS: 20.0000 mg | ORAL_TABLET | Freq: Every day | ORAL | 3 refills | Status: DC

## 2016-02-09 NOTE — Telephone Encounter (Signed)
Please call and let patient know lisinopril has been refilled

## 2016-02-09 NOTE — Telephone Encounter (Signed)
Would like to have a refill on blood pressure med. Needs this asap. Please call and let him know when the Rx has been sent. Call [phone]. Sunday SpillersSharon T Alfreida Steffenhagen, CMA

## 2016-02-09 NOTE — Addendum Note (Signed)
Addended by: Noralee CharsMIKELL, Makari Portman Z on: 02/09/2016 01:36 PM   Modules accepted: Orders

## 2016-02-10 NOTE — Telephone Encounter (Signed)
Patient informed. 

## 2016-03-25 ENCOUNTER — Ambulatory Visit (INDEPENDENT_AMBULATORY_CARE_PROVIDER_SITE_OTHER): Payer: PRIVATE HEALTH INSURANCE | Admitting: Internal Medicine

## 2016-03-25 ENCOUNTER — Encounter: Payer: Self-pay | Admitting: Internal Medicine

## 2016-03-25 VITALS — BP 136/92 | HR 98 | Temp 98.2°F | Ht 73.0 in | Wt 264.0 lb

## 2016-03-25 DIAGNOSIS — E785 Hyperlipidemia, unspecified: Secondary | ICD-10-CM | POA: Diagnosis not present

## 2016-03-25 DIAGNOSIS — E119 Type 2 diabetes mellitus without complications: Secondary | ICD-10-CM | POA: Diagnosis not present

## 2016-03-25 DIAGNOSIS — I10 Essential (primary) hypertension: Secondary | ICD-10-CM

## 2016-03-25 DIAGNOSIS — K469 Unspecified abdominal hernia without obstruction or gangrene: Secondary | ICD-10-CM

## 2016-03-25 LAB — POCT GLYCOSYLATED HEMOGLOBIN (HGB A1C): HEMOGLOBIN A1C: 6

## 2016-03-25 MED ORDER — LISINOPRIL 10 MG PO TABS: 20.0000 mg | ORAL_TABLET | Freq: Every day | ORAL | 1 refills | Status: DC

## 2016-03-25 MED ORDER — METFORMIN HCL 1000 MG PO TABS: 1000.0000 mg | ORAL_TABLET | Freq: Two times a day (BID) | ORAL | 3 refills | Status: DC

## 2016-03-25 MED ORDER — LOVASTATIN 20 MG PO TABS: 20.0000 mg | ORAL_TABLET | Freq: Every day | ORAL | 3 refills | Status: DC

## 2016-03-25 NOTE — Progress Notes (Signed)
[facility] Progress [facility]  Subjective:  Mark HeckDavid Miles is a 51 y.o. male here for follow-up of T2DM and medication refills.  #T2DM: - Stopped lantus in December because of A1c of 5.8 - Blood sugar logs show low of 69 (felt bad that day) and high of 226 and 183 (after eating sweets) but most numbers low 100s below 110.  - Would like to lose weight but is still snacking while working at [employer] - Drinks mostly water and only diet soda - Walks 3-4 nights a week - Taking metformin 1000 mg BID - Has eye exam scheduled for April 10th - Not taking gabapentin because was too expensive. Now that he has insurance thinks he can go without for the time begin as only has burning pain in thighs when standing for a long time.  ROS: No increased urination, no abdominal upset  #HTN: - Needs refill on lisinopril - Had normal BMP November 2017 (K of 4.0 and SCr 1.06) ROS: No cough, no dizziness  #Ventral Hernia: - after appendectomy in 2016 - large but not painful. Interested in Psychologist, forensicrepair.   Social: Never smoker  No Known Allergies  Objective: Blood pressure (!) 136/92, pulse 98, temperature 98.2 F (36.8 C), temperature source Oral, height 6\' 1"  (1.854 m), weight 264 lb (119.7 kg). Body mass index is 34.83 kg/m. Constitutional: Obese male, very pleasant in NAD Cardiovascular: RRR, S1, S2, no m/r/g.  Pulmonary/Chest: Effort normal and breath sounds normal. No respiratory distress.  Abdominal: Soft. +BS, large reducible ventral hernia, NT.  Musculoskeletal: No LE edema Vitals reviewed  Assessment/Plan: DM2 (diabetes mellitus, type 2) (HCC) - Well controlled with A1c of 6.0 today; continue metformin 1000 mg BID - Discussed impact of weight loss on blood sugar control and set weight loss goal of 25 lbs for now. Reviewed BMI chart to show healthy BMI would be under 200 lbs.  - Patient to pack snacks for work to try to resist eating fried food there - Plans to increase frequency  of walking as weather gets warmer - Eye exam scheduled for next month  HTN (hypertension) - Just above goal of < 140/90 today but has been at target last 2 office visits - Refilled lisinopril. Consider increasing or adding HCTZ if still elevated at next appointment.  - Repeat BMP this fall  Abdominal hernia - Placed referral to General Surgery to consider correction  Follow-up A1c in 6 months. If BP above 140/90 consistently, follow-up sooner.  Dani GobbleHillary Fitzgerald, MD [facility], PGY-2

## 2016-03-25 NOTE — Patient Instructions (Signed)
Mr. Mark GivensFranklin,  Your blood sugar looks really good. Weight loss and increasing physical activity will continue to help.  Please continue metformin 1000 mg twice daily.  I will refill your blood pressure medicine.  Please see us back in 6 months for labs and recheck of A1c.  Best, Dr. Sampson GoonFitzgerald

## 2016-03-27 NOTE — Assessment & Plan Note (Signed)
-   Placed referral to General Surgery to consider correction

## 2016-03-27 NOTE — Assessment & Plan Note (Signed)
-   Well controlled with A1c of 6.0 today; continue metformin 1000 mg BID - Discussed impact of weight loss on blood sugar control and set weight loss goal of 25 lbs for now. Reviewed BMI chart to show healthy BMI would be under 200 lbs.  - Patient to pack snacks for work to try to resist eating fried food there - Plans to increase frequency of walking as weather gets warmer - Eye exam scheduled for next month

## 2016-03-27 NOTE — Assessment & Plan Note (Signed)
-   Just above goal of < 140/90 today but has been at target last 2 office visits - Refilled lisinopril. Consider increasing or adding HCTZ if still elevated at next appointment.  - Repeat BMP this fall

## 2016-08-16 ENCOUNTER — Telehealth: Payer: Self-pay | Admitting: *Deleted

## 2016-08-16 NOTE — Telephone Encounter (Signed)
Received fax from pharmacy stating the $4 list has changed, therefore, patient requesting to change from lovastatin to either simvastatin or atorvastatin.

## 2016-08-17 MED ORDER — ATORVASTATIN CALCIUM 40 MG PO TABS: 40.0000 mg | ORAL_TABLET | Freq: Every day | ORAL | 3 refills | Status: DC

## 2016-08-17 NOTE — Telephone Encounter (Signed)
Sent in prescription for atorvastatin, stopped lovostatin. Please let patient know.

## 2016-09-29 ENCOUNTER — Ambulatory Visit (INDEPENDENT_AMBULATORY_CARE_PROVIDER_SITE_OTHER): Payer: PRIVATE HEALTH INSURANCE | Admitting: Internal Medicine

## 2016-09-29 ENCOUNTER — Encounter: Payer: Self-pay | Admitting: Internal Medicine

## 2016-09-29 VITALS — BP 130/86 | HR 78 | Temp 98.3°F | Ht 73.0 in | Wt 272.0 lb

## 2016-09-29 DIAGNOSIS — E119 Type 2 diabetes mellitus without complications: Secondary | ICD-10-CM | POA: Diagnosis not present

## 2016-09-29 DIAGNOSIS — E785 Hyperlipidemia, unspecified: Secondary | ICD-10-CM

## 2016-09-29 DIAGNOSIS — I10 Essential (primary) hypertension: Secondary | ICD-10-CM | POA: Diagnosis not present

## 2016-09-29 DIAGNOSIS — Z23 Encounter for immunization: Secondary | ICD-10-CM

## 2016-09-29 LAB — POCT GLYCOSYLATED HEMOGLOBIN (HGB A1C): Hemoglobin A1C: 6.3

## 2016-09-29 MED ORDER — LISINOPRIL 20 MG PO TABS: 20.0000 mg | ORAL_TABLET | Freq: Every day | ORAL | 3 refills | Status: DC

## 2016-09-29 NOTE — Patient Instructions (Addendum)
Think about making diet changes as we discussed. I think you're doing excellent. Please follow-up with me in 6 months. Please try to get your eye exam within the next couple months. Please make sure that they send these results to the clinic

## 2016-09-29 NOTE — Progress Notes (Signed)
   [facility] [name], MD Phone: [phone]  Reason For Visit: Follow up   # CHRONIC DM, Type 2: A1C 6.3  Metformin 1000 mg BID  Reports good compliance. Tolerating well w/o side-effects Currently on ACEi / ARB  Lifestyle: Has been eating a lot more [name], has not been exercising very much lately  Patient planning following up with opthamology soon  Denies polyuria, visual changes, numbness or tingling.  # CHRONIC HTN: Reports  Current Meds -Lisnopril 20 mg daily Reports good compliance, took meds today. Tolerating well, w/o complaints. Lifestyle - as above  Denies CP, dyspnea, HA, edema, dizziness / lightheadedness  # HYPERLIPIDEMIA Disease Monitoring: See symptoms for Hypertension Medications:Atrovastatin - recently increased the price of medication  Compliance- no issues with affording currently  Right upper quadrant pain- none  Muscle aches- none    Past Medical History Reviewed problem list.  Medications- reviewed and updated No additions to family history Social history- patient is a non=smoker  Objective: BP 130/86   Pulse 78   Temp 98.3 F (36.8 C) (Oral)   Ht  (1.854 m)   Wt 272 lb (123.4 kg)   SpO2 98%   BMI 35.89 kg/m  Gen: NAD, alert, cooperative with exam Cardio: regular rate and rhythm, S1S2 heard, no murmurs appreciated Pulm: clear to auscultation bilaterally, no wheezes, rhonchi or rales Skin: dry, intact, no rashes or lesions    Assessment/Plan: See problem based a/p  DM2 (diabetes mellitus, type 2) (HCC) A1C 6.3 Continue Metformin BID  Plans to see ophthalmology Discussed nutrition with patient Follow-up in 3 months  HTN (hypertension) Blood pressure minimally elevated for diastolic - discussed stopping Wednesdays and discussed good nutrition choices - Continue lisinopril 20 mg once daily - Follow up in 3 months  HLD (hyperlipidemia) Continue atorvastatin and aspirin

## 2016-10-04 NOTE — Assessment & Plan Note (Signed)
A1C 6.3 Continue Metformin BID  Plans to see ophthalmology Discussed nutrition with patient Follow-up in 3 months

## 2016-10-04 NOTE — Assessment & Plan Note (Signed)
Blood pressure minimally elevated for diastolic - discussed stopping Wednesdays and discussed good nutrition choices - Continue lisinopril 20 mg once daily - Follow up in 3 months

## 2016-10-04 NOTE — Assessment & Plan Note (Signed)
Continue atorvastatin and aspirin ?

## 2017-01-05 ENCOUNTER — Telehealth: Payer: Self-pay | Admitting: Internal Medicine

## 2017-01-05 DIAGNOSIS — K458 Other specified abdominal hernia without obstruction or gangrene: Secondary | ICD-10-CM

## 2017-01-05 NOTE — Telephone Encounter (Signed)
Clinical info completed on DMV form.  Place form in Dr. Aris GeorgiaMikell's box for completion.  Feliz BeamHARTSELL,  JAZMIN, CMA

## 2017-01-05 NOTE — Telephone Encounter (Signed)
Disability form dropped off for at front desk for completion.  Verified that patient section of form has been completed.  Last DOS/WCC with PCP was 09/29/16.  Placed form in team folder to be completed by clinical staff.  [name]

## 2017-01-11 NOTE — Telephone Encounter (Signed)
Called patient to ask about disability. He would like me to place his HTN, diabetes, umbilical hernia, and  Diabetic neuropathy on the form and have it faxed in to Disability office. Agreed to do this. He plans to follow up in March. He would also like another referral to surgery for repair of his umbilical hernia. I agreed to do this.

## 2017-01-13 NOTE — Telephone Encounter (Signed)
After reviewing the form decided to bring the patient in for a formal evaluation as I am unable to fill out the form as is

## 2017-02-21 ENCOUNTER — Ambulatory Visit (INDEPENDENT_AMBULATORY_CARE_PROVIDER_SITE_OTHER): Payer: BLUE CROSS/BLUE SHIELD | Admitting: Internal Medicine

## 2017-02-21 ENCOUNTER — Other Ambulatory Visit: Payer: Self-pay

## 2017-02-21 ENCOUNTER — Encounter: Payer: Self-pay | Admitting: Internal Medicine

## 2017-02-21 VITALS — BP 148/92 | HR 82 | Temp 98.2°F | Ht 73.0 in | Wt 265.8 lb

## 2017-02-21 DIAGNOSIS — E119 Type 2 diabetes mellitus without complications: Secondary | ICD-10-CM

## 2017-02-21 DIAGNOSIS — E1142 Type 2 diabetes mellitus with diabetic polyneuropathy: Secondary | ICD-10-CM

## 2017-02-21 NOTE — Progress Notes (Signed)
   [facility] Mark CharsAsiyah Samaiyah Howes, MD Phone: [phone]  Reason For Visit: Paperwork   #Disability paperwork  Patient is following up with me concerning his paperwork for disability.  He states that this is a stat second time applying for disability.  He initially saw disability physician and was denied disability.  He states that he has significant diabetic neuropathy.  Specifically he feels like he has pain and tingling in his feet.  He states his job is such that he stands most of the day and it is extremely painful for him.  He feels like he has to take breaks every 1-1/2 hours in order to help deal with the pain.  He does take his gabapentin regularly as prescribed.   # CHRONIC DM, Type 2: Reports no concerns CBGs:Not currently checking  Meds: Metformin 1000 mg BID  Reports good compliance with this medication. Tolerating well w/o side-effects Currently on ACEi / ARB Lifestyle: Currently patient has noted increased current urination.  He states that he has been eating a lot more sweets lately and has not been on the same regimented diet that he was on previously Any hypoglycemia episodes: Denies palpations, diaphoresis, fatigue, weakness, jittery Endorse polyuria numbness and tingling Denies any vision changes   Past Medical History Reviewed problem list.  Medications- reviewed and updated No additions to family history Social history- patient is a non-smoker  Objective: BP (!) 148/92   Pulse 82   Temp 98.2 F (36.8 C) (Oral)   Ht 6\' 1"  (1.854 m)   Wt 265 lb 12.8 oz (120.6 kg)   SpO2 97%   BMI 35.07 kg/m  Gen: NAD, alert, cooperative with exam Cardio: regular rate and rhythm, S1S2 heard, no murmurs appreciated Pulm: clear to auscultation bilaterally, no wheezes, rhonchi or rales MSK: Bilateral lower leg, 5 out of 5 strength in lower extremities, no abnormalities noted on inspection, normal range of motion, notes tingling in his bilateral feet, unable to  palpate DP pulses  Assessment/Plan: See problem based a/p  Diabetic neuropathy (HCC) Significant polyneuropathy which he states is affecting his ability to work.  Given patient patient has leg pain with work and standing, will evaluate for arterial blood flow with ABI's. Can consider increasing his gabapentin as well.  Follow up in 1 month   DM2 (diabetes mellitus, type 2) (HCC) Diabetes, likely uncontrolled given patient with polyuria and stating that he has been eating a lot more sweets than he normally did previously -Currently only on metformin 1000 mg twice daily -Needs a foot exam, eye exam -Needs  most most health maintenance which she can now obtain as he has health insurance -We will follow-up with him a visit in the near future to discuss these things

## 2017-02-21 NOTE — Patient Instructions (Signed)
I will her back fill out your form that needs to be done within the week.  Please make sure to follow-up with me in about 1 week continue to see how you are doing for your diabetes and your blood pressure.

## 2017-02-22 LAB — HEMOGLOBIN A1C
ESTIMATED AVERAGE GLUCOSE: 237 mg/dL
HEMOGLOBIN A1C: 9.9 % — AB (ref 4.8–5.6)

## 2017-02-22 NOTE — Assessment & Plan Note (Addendum)
Significant polyneuropathy which he states is affecting his ability to work.  Given patient patient has leg pain with work and standing, will evaluate for arterial blood flow with ABI's. Can consider increasing his gabapentin as well.  Follow up in 1 month

## 2017-02-22 NOTE — Assessment & Plan Note (Signed)
Diabetes, likely uncontrolled given patient with polyuria and stating that he has been eating a lot more sweets than he normally did previously -Currently only on metformin 1000 mg twice daily -Needs a foot exam, eye exam -Needs  most most health maintenance which she can now obtain as he has health insurance -We will follow-up with him a visit in the near future to discuss these things

## 2017-02-28 ENCOUNTER — Telehealth: Payer: Self-pay | Admitting: Internal Medicine

## 2017-02-28 DIAGNOSIS — I10 Essential (primary) hypertension: Secondary | ICD-10-CM

## 2017-02-28 MED ORDER — AMLODIPINE BESYLATE 10 MG PO TABS: 10.0000 mg | ORAL_TABLET | Freq: Every day | ORAL | 2 refills | Status: DC

## 2017-02-28 NOTE — Telephone Encounter (Signed)
Call Mr. Mark Miles about his A1C.  Significantly decreased his sugar and dessert intake.  He believes that this is the significant contributor to his current A1c.  He is concerned about his blood pressure states his blood pressures have been elevated when he is checked them at work.  Will place him on Norvasc and follow-up in about 1 month.

## 2017-03-03 ENCOUNTER — Encounter (HOSPITAL_COMMUNITY): Payer: BLUE CROSS/BLUE SHIELD

## 2017-03-09 ENCOUNTER — Telehealth: Payer: Self-pay | Admitting: Internal Medicine

## 2017-03-31 IMAGING — DX DG CHEST 2V
2 series · 2 of 2 positions shown · non-contrast
Comparison: None.

CLINICAL DATA: Hiccoughs

EXAM:
CHEST  2 VIEW

[w chest pa]
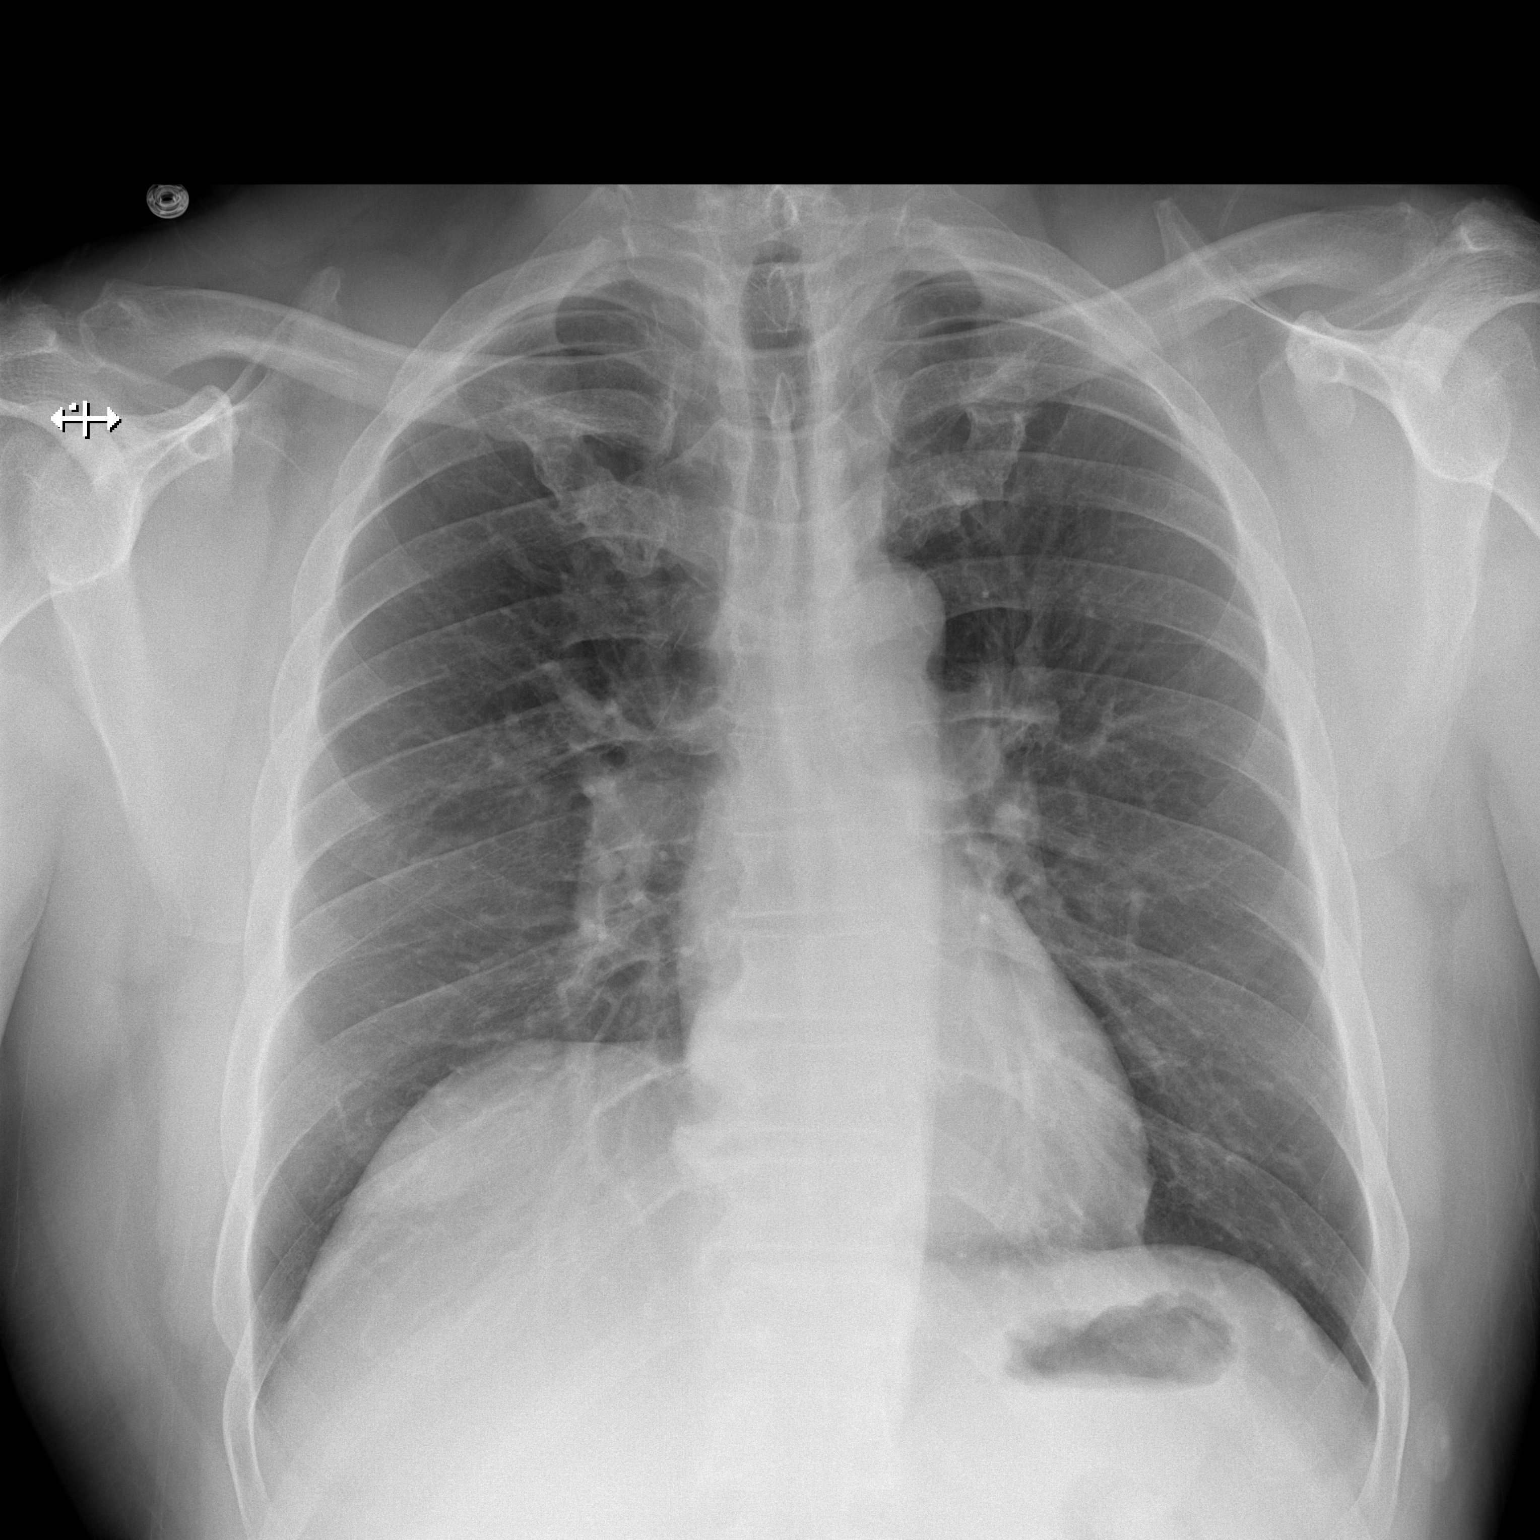

[w chest lat]
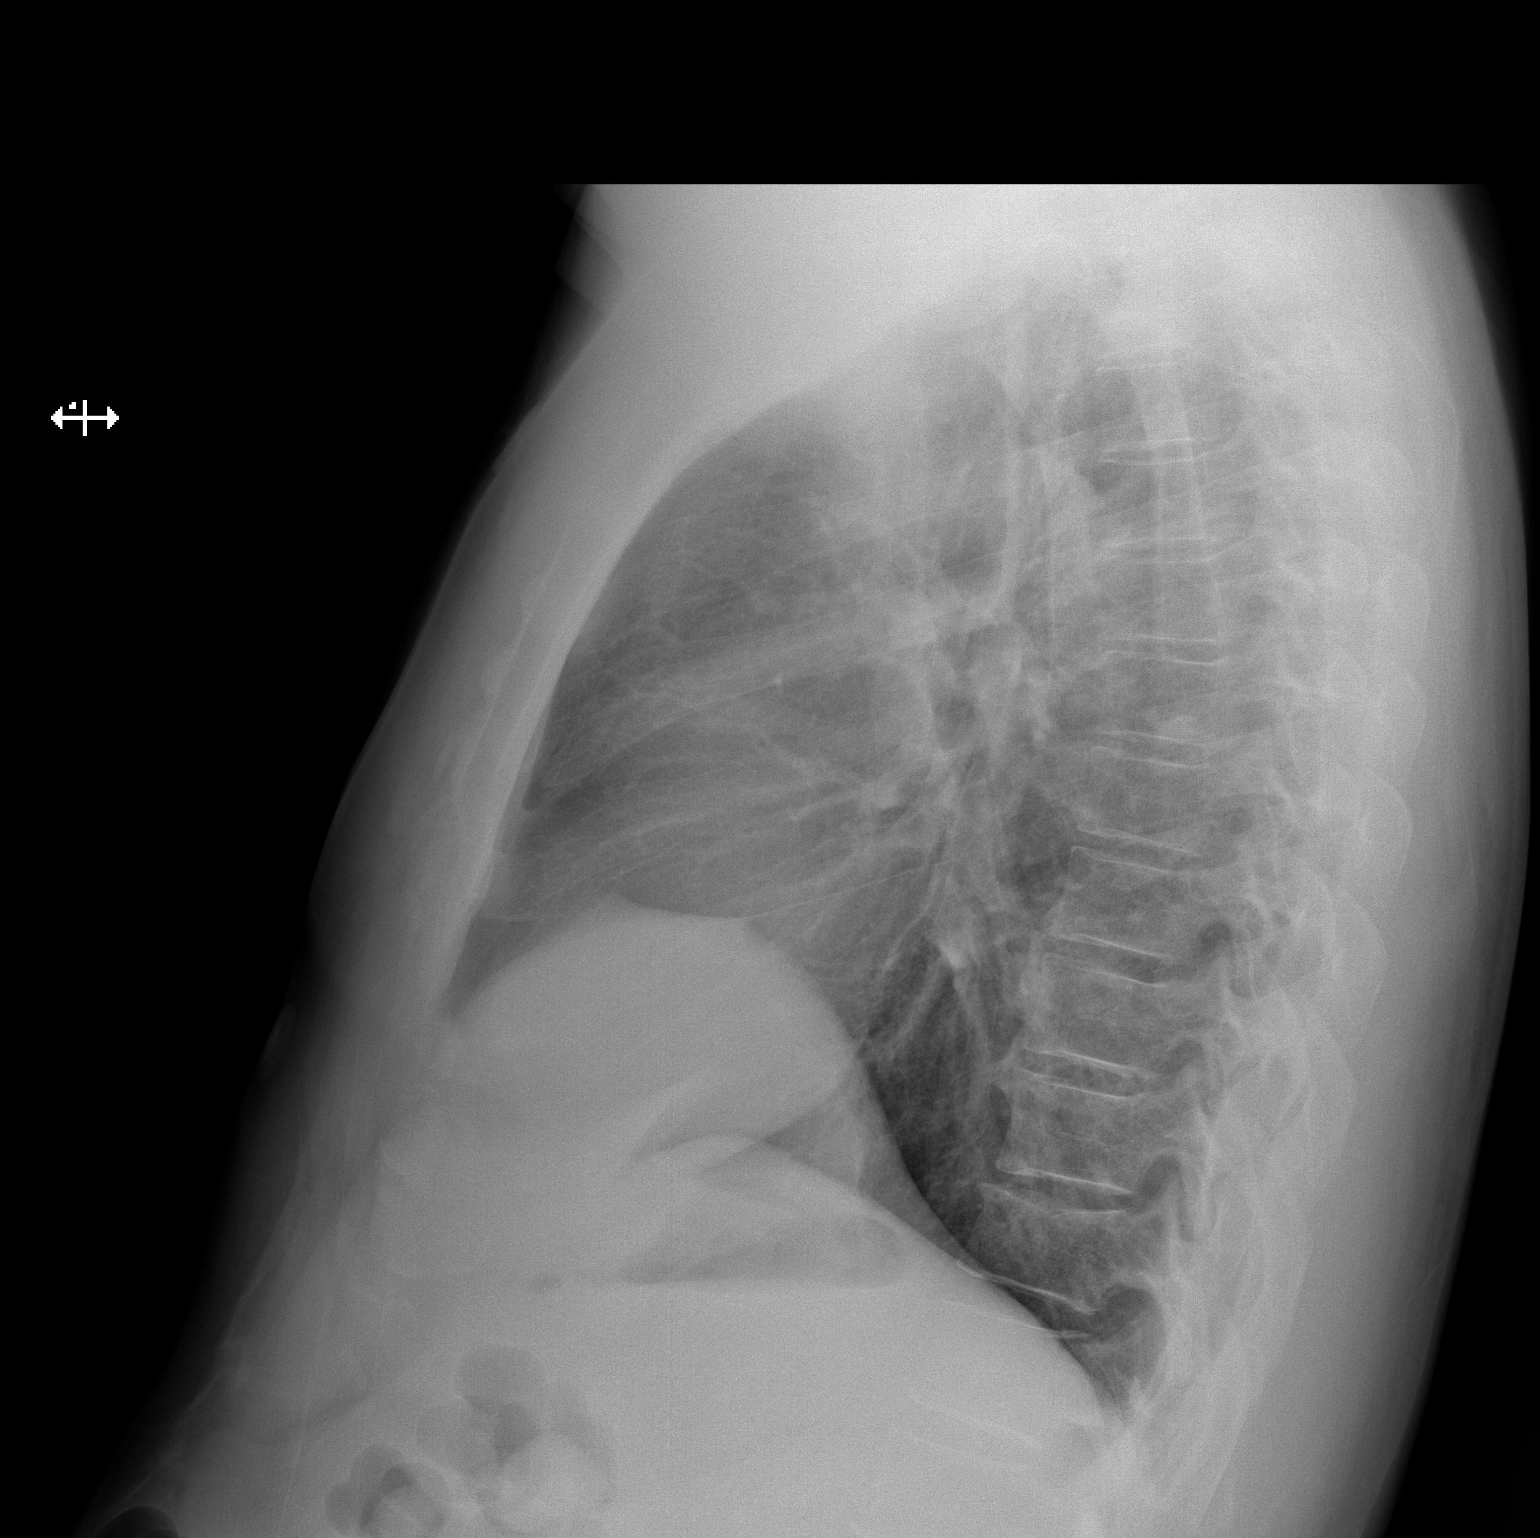

[2 of 2 positions shown; findings below may reference images not displayed]

FINDINGS: Normal heart size. Possible posterior mid lung nodule is noted on
the lateral view. No pneumothorax. No pleural effusion.
IMPRESSION: Possible lung nodule. Comparison a prior studies are recommended. If
none are available, CT can be performed.

## 2017-04-08 ENCOUNTER — Ambulatory Visit: Payer: Self-pay | Admitting: General Surgery
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
## 2017-04-12 ENCOUNTER — Other Ambulatory Visit: Payer: Self-pay | Admitting: General Surgery

## 2017-04-12 DIAGNOSIS — K432 Incisional hernia without obstruction or gangrene: Secondary | ICD-10-CM

## 2017-04-20 ENCOUNTER — Ambulatory Visit
Admission: RE | Admit: 2017-04-20 | Discharge: 2017-04-20 | Disposition: A | Discharge status: Home / Self Care | Payer: BLUE CROSS/BLUE SHIELD | Source: Ambulatory Visit | Attending: General Surgery | Admitting: General Surgery

## 2017-04-20 DIAGNOSIS — K432 Incisional hernia without obstruction or gangrene: Secondary | ICD-10-CM

## 2017-04-22 ENCOUNTER — Telehealth: Payer: Self-pay | Admitting: Family Medicine

## 2017-04-22 DIAGNOSIS — E119 Type 2 diabetes mellitus without complications: Secondary | ICD-10-CM

## 2017-04-22 MED ORDER — METFORMIN HCL 1000 MG PO TABS: 1000.0000 mg | ORAL_TABLET | Freq: Two times a day (BID) | ORAL | 3 refills | Status: DC

## 2017-04-22 NOTE — Telephone Encounter (Signed)
**  After Hours/ Emergency Line Call*  Received a call to report that Mark Miles is out of metformin.  Endorsing need for refill. Refilled medication. Will forward to PCP.  Tillman SersAngela C Redding Cloe, DO PGY-2, [facility]

## 2017-04-29 ENCOUNTER — Encounter (HOSPITAL_COMMUNITY): Payer: Self-pay

## 2017-05-02 ENCOUNTER — Other Ambulatory Visit: Payer: Self-pay

## 2017-05-02 ENCOUNTER — Encounter (HOSPITAL_COMMUNITY): Payer: Self-pay

## 2017-05-02 ENCOUNTER — Encounter (HOSPITAL_COMMUNITY)
Admission: RE | Admit: 2017-05-02 | Discharge: 2017-05-02 | Disposition: A | Discharge status: Home / Self Care | Payer: BLUE CROSS/BLUE SHIELD | Source: Ambulatory Visit | Attending: General Surgery | Admitting: General Surgery

## 2017-05-02 DIAGNOSIS — Z01812 Encounter for preprocedural laboratory examination: Secondary | ICD-10-CM | POA: Diagnosis not present

## 2017-05-02 DIAGNOSIS — K432 Incisional hernia without obstruction or gangrene: Secondary | ICD-10-CM | POA: Diagnosis not present

## 2017-05-02 DIAGNOSIS — Z79899 Other long term (current) drug therapy: Secondary | ICD-10-CM | POA: Diagnosis not present

## 2017-05-02 DIAGNOSIS — Z7982 Long term (current) use of aspirin: Secondary | ICD-10-CM | POA: Diagnosis not present

## 2017-05-02 DIAGNOSIS — Z7984 Long term (current) use of oral hypoglycemic drugs: Secondary | ICD-10-CM | POA: Insufficient documentation

## 2017-05-02 HISTORY — DX: Type 2 diabetes mellitus with diabetic neuropathy, unspecified: E11.40

## 2017-05-02 HISTORY — DX: Essential (primary) hypertension: I10

## 2017-05-02 HISTORY — DX: Type 2 diabetes mellitus without complications: E11.9

## 2017-05-02 HISTORY — DX: Headache: R51

## 2017-05-02 HISTORY — DX: Headache, unspecified: R51.9

## 2017-05-02 LAB — BASIC METABOLIC PANEL
ANION GAP: 10 (ref 5–15)
BUN: 13 mg/dL (ref 6–20)
CALCIUM: 9.1 mg/dL (ref 8.9–10.3)
CO2: 25 mmol/L (ref 22–32)
Chloride: 102 mmol/L (ref 101–111)
Creatinine, Ser: 1.01 mg/dL (ref 0.61–1.24)
GLUCOSE: 96 mg/dL (ref 65–99)
Potassium: 4.1 mmol/L (ref 3.5–5.1)
Sodium: 137 mmol/L (ref 135–145)

## 2017-05-02 LAB — CBC
HCT: 43.6 % (ref 39.0–52.0)
Hemoglobin: 14.3 g/dL (ref 13.0–17.0)
MCH: 28.8 pg (ref 26.0–34.0)
MCHC: 32.8 g/dL (ref 30.0–36.0)
MCV: 87.9 fL (ref 78.0–100.0)
PLATELETS: 193 10*3/uL (ref 150–400)
RBC: 4.96 MIL/uL (ref 4.22–5.81)
RDW: 14.4 % (ref 11.5–15.5)
WBC: 3.8 10*3/uL — AB (ref 4.0–10.5)

## 2017-05-02 LAB — GLUCOSE, CAPILLARY: Glucose-Capillary: 95 mg/dL (ref 65–99)

## 2017-05-02 LAB — HEMOGLOBIN A1C
Hgb A1c MFr Bld: 7.2 % — ABNORMAL HIGH (ref 4.8–5.6)
MEAN PLASMA GLUCOSE: 159.94 mg/dL

## 2017-05-02 NOTE — Progress Notes (Addendum)
PCP is Dr. Jesse Fall ([facility])  LOV 02/2017 She also handles the diabetes.  Patient checks his sugar 1-2 x a week and it usually runs 97-200 range. He was dx around 2016 I did call over to [facility] and spoke with Reinaldo Meeker, triage nurse, concerning a A1C drawn over there on 03/02/2017 - 8.2 He denies murmur, cp, sob, no cardiac tests

## 2017-05-02 NOTE — Progress Notes (Signed)
   05/02/17 1516  OBSTRUCTIVE SLEEP APNEA  Have you ever been diagnosed with sleep apnea through a sleep study? No  Do you snore loudly (loud enough to be heard through closed doors)?  1  Do you often feel tired, fatigued, or sleepy during the daytime (such as falling asleep during driving or talking to someone)? 0  Has anyone observed you stop breathing during your sleep? 1  Do you have, or are you being treated for high blood pressure? 1  BMI more than 35 kg/m2? 0  Age > 50 (1-yes) 1  Neck circumference greater than:Male 16 inches or larger, Male 17inches or larger? 1  Male Gender (Yes=1) 1  Obstructive Sleep Apnea Score 6  Score 5 or greater  Results sent to PCP

## 2017-05-02 NOTE — Pre-Procedure Instructions (Signed)
Mark Miles  05/02/2017      [facility] - [place], [place] - [address] [place] [address] Phone: [phone] Fax: [phone]    Your procedure is scheduled on Monday, May 6th   Report to [facility] Admitting at 5:30 AM             (posted surgery time 7:30a - 10:30a)   Call this number if you have problems the morning of surgery:  [phone]   Remember:              You will need to drink the "Ensure Pre Surgery" drink the day before (in the AM and PM)             And will need to drink another "Ensure Pre Surgery" drink an hour before you leave the house.  (total of 3 drinks!)   Do not eat food or drink liquids after midnight, Sunday.   Take these medicines the morning of surgery with A SIP OF WATER : Amlodipine.   Do not wear jewelry - no rings or watches.  Do not wear lotions, colognes or deodorant.             Men may shave face and neck.  Do not bring valuables to the hospital.  [facility] is not responsible for any belongings or valuables.  Contacts, dentures or bridgework may not be worn into surgery.  Leave your suitcase in the car.  After surgery it may be brought to your room.  For patients admitted to the hospital, discharge time will be determined by your treatment team.  Patients discharged the day of surgery will not be allowed to drive home.   Please read over the following fact sheets that you were given. Pain Booklet and Surgical Site Infection Prevention      How to Manage Your Diabetes Before and After Surgery  Why is it important to control my blood sugar before and after surgery? . Improving blood sugar levels before and after surgery helps healing and can limit problems. . A way of improving blood sugar control is eating a healthy diet by: o  Eating less sugar and carbohydrates o  Increasing activity/exercise o  Talking with your doctor about reaching your blood sugar  goals . High blood sugars (greater than 180 mg/dL) can raise your risk of infections and slow your recovery, so you will need to focus on controlling your diabetes during the weeks before surgery. . Make sure that the doctor who takes care of your diabetes knows about your planned surgery including the date and location.  How do I manage my blood sugar before surgery? . Check your blood sugar at least 4 times a day, starting 2 days before surgery, to make sure that the level is not too high or low. o Check your blood sugar the morning of your surgery when you wake up and every 2 hours until you get to the Short Stay unit. o  . If your blood sugar is less than 70 mg/dL, you will need to treat for low blood sugar: o Do not take insulin. o Treat a low blood sugar (less than 70 mg/dL) with  cup of clear juice (cranberry or apple), 4 glucose tablets, OR glucose gel. o NO ORANGE JUICE. o  Recheck blood sugar in 15 minutes after treatment (to make sure it is greater than 70 mg/dL). If your blood sugar is  not greater than 70 mg/dL on recheck, call [phone] o  for further instructions. . Report your blood sugar to the short stay nurse when you get to Short Stay.  . If you are admitted to the hospital after surgery: o Your blood sugar will be checked by the staff and you will probably be given insulin after surgery (instead of oral diabetes medicines) to make sure you have good blood sugar levels. o The goal for blood sugar control after surgery is 80-180 mg/dL.  WHAT DO I DO ABOUT MY DIABETES MEDICATION?  Marland Kitchen Do not take oral diabetes medicines (pills) the morning of surgery.   . The day of surgery, do not take other diabetes injectables, including Byetta (exenatide), Bydureon (exenatide ER), Victoza (liraglutide), or Trulicity (dulaglutide).  . If your CBG is greater than 220 mg/dL, you may take  of your sliding scale (correction) dose of insulin.  Other  Instructions:          Patient Signature:  Date:   Nurse Signature:  Date:   Reviewed and Endorsed by [facility] Patient Education Committee, August 2015

## 2017-05-09 ENCOUNTER — Inpatient Hospital Stay (HOSPITAL_COMMUNITY): Payer: BLUE CROSS/BLUE SHIELD | Admitting: Certified Registered Nurse Anesthetist

## 2017-05-09 ENCOUNTER — Encounter (HOSPITAL_COMMUNITY): Payer: Self-pay

## 2017-05-09 ENCOUNTER — Other Ambulatory Visit: Payer: Self-pay

## 2017-05-09 ENCOUNTER — Encounter (HOSPITAL_COMMUNITY)
Admission: RE | Disposition: A | Discharge status: Home / Self Care | Payer: Self-pay | Source: Home / Self Care | Attending: General Surgery

## 2017-05-09 ENCOUNTER — Inpatient Hospital Stay (HOSPITAL_COMMUNITY): Payer: BLUE CROSS/BLUE SHIELD | Admitting: Vascular Surgery

## 2017-05-09 ENCOUNTER — Inpatient Hospital Stay (HOSPITAL_COMMUNITY)
Admission: RE | Admit: 2017-05-09 | Discharge: 2017-05-11 | DRG: 337 | Disposition: A | Discharge status: Home / Self Care | Payer: BLUE CROSS/BLUE SHIELD | Attending: General Surgery | Admitting: General Surgery

## 2017-05-09 DIAGNOSIS — E119 Type 2 diabetes mellitus without complications: Secondary | ICD-10-CM | POA: Diagnosis present

## 2017-05-09 DIAGNOSIS — Z7984 Long term (current) use of oral hypoglycemic drugs: Secondary | ICD-10-CM

## 2017-05-09 DIAGNOSIS — K432 Incisional hernia without obstruction or gangrene: Secondary | ICD-10-CM | POA: Diagnosis present

## 2017-05-09 DIAGNOSIS — Z7982 Long term (current) use of aspirin: Secondary | ICD-10-CM | POA: Diagnosis not present

## 2017-05-09 DIAGNOSIS — Z79899 Other long term (current) drug therapy: Secondary | ICD-10-CM | POA: Diagnosis not present

## 2017-05-09 DIAGNOSIS — K660 Peritoneal adhesions (postprocedural) (postinfection): Secondary | ICD-10-CM | POA: Diagnosis present

## 2017-05-09 DIAGNOSIS — Z8719 Personal history of other diseases of the digestive system: Secondary | ICD-10-CM

## 2017-05-09 DIAGNOSIS — Z9889 Other specified postprocedural states: Secondary | ICD-10-CM

## 2017-05-09 HISTORY — PX: INSERTION OF MESH: SHX5868

## 2017-05-09 HISTORY — PX: INCISIONAL HERNIA REPAIR: SHX193

## 2017-05-09 LAB — GLUCOSE, CAPILLARY
GLUCOSE-CAPILLARY: 284 mg/dL — AB (ref 65–99)
Glucose-Capillary: 149 mg/dL — ABNORMAL HIGH (ref 65–99)
Glucose-Capillary: 121 mg/dL — ABNORMAL HIGH (ref 65–99)
Glucose-Capillary: 135 mg/dL — ABNORMAL HIGH (ref 65–99)

## 2017-05-09 SURGERY — REPAIR, HERNIA, INCISIONAL
Anesthesia: General | Site: Abdomen

## 2017-05-09 MED ORDER — AMLODIPINE BESYLATE 10 MG PO TABS
10.0000 mg | ORAL_TABLET | Freq: Every day | ORAL | Status: DC
  Administered 2017-05-10 – 2017-05-11 (×2): 10 mg via ORAL
  Filled 2017-05-09 (×2): qty 1

## 2017-05-09 MED ORDER — CELECOXIB 200 MG PO CAPS
200.0000 mg | ORAL_CAPSULE | ORAL | Status: AC
  Administered 2017-05-09: 200 mg via ORAL
  Filled 2017-05-09: qty 1

## 2017-05-09 MED ORDER — CHLORHEXIDINE GLUCONATE CLOTH 2 % EX PADS: 6.0000 | MEDICATED_PAD | Freq: Once | CUTANEOUS | Status: DC

## 2017-05-09 MED ORDER — PROPOFOL 10 MG/ML IV BOLUS
INTRAVENOUS | Status: AC
  Filled 2017-05-09: qty 40

## 2017-05-09 MED ORDER — LISINOPRIL 20 MG PO TABS
20.0000 mg | ORAL_TABLET | Freq: Every day | ORAL | Status: DC
  Administered 2017-05-09 – 2017-05-11 (×3): 20 mg via ORAL
  Filled 2017-05-09 (×3): qty 1

## 2017-05-09 MED ORDER — LIDOCAINE 2% (20 MG/ML) 5 ML SYRINGE
INTRAMUSCULAR | Status: AC
  Filled 2017-05-09: qty 5

## 2017-05-09 MED ORDER — ONDANSETRON HCL 4 MG/2ML IJ SOLN
4.0000 mg | Freq: Four times a day (QID) | INTRAMUSCULAR | Status: DC | PRN
  Administered 2017-05-10 – 2017-05-11 (×3): 4 mg via INTRAVENOUS
  Filled 2017-05-09 (×3): qty 2

## 2017-05-09 MED ORDER — MIDAZOLAM HCL 2 MG/2ML IJ SOLN
INTRAMUSCULAR | Status: AC
  Filled 2017-05-09: qty 2

## 2017-05-09 MED ORDER — GABAPENTIN 300 MG PO CAPS
300.0000 mg | ORAL_CAPSULE | Freq: Two times a day (BID) | ORAL | Status: DC
  Administered 2017-05-09 – 2017-05-11 (×4): 300 mg via ORAL
  Filled 2017-05-09 (×4): qty 1

## 2017-05-09 MED ORDER — ENSURE PRE-SURGERY PO LIQD
592.0000 mL | Freq: Once | ORAL | Status: DC
  Filled 2017-05-09: qty 592

## 2017-05-09 MED ORDER — METOCLOPRAMIDE HCL 5 MG/ML IJ SOLN
10.0000 mg | Freq: Four times a day (QID) | INTRAMUSCULAR | Status: DC | PRN
  Administered 2017-05-11: 10 mg via INTRAVENOUS
  Filled 2017-05-09: qty 2

## 2017-05-09 MED ORDER — HYDROMORPHONE HCL 2 MG/ML IJ SOLN
0.2500 mg | INTRAMUSCULAR | Status: DC | PRN
  Administered 2017-05-09 (×2): 0.5 mg via INTRAVENOUS
  Administered 2017-05-09: 1 mg via INTRAVENOUS

## 2017-05-09 MED ORDER — DEXTROSE-NACL 5-0.9 % IV SOLN
INTRAVENOUS | Status: DC
  Administered 2017-05-09 – 2017-05-10 (×2): via INTRAVENOUS

## 2017-05-09 MED ORDER — ENSURE PRE-SURGERY PO LIQD
296.0000 mL | Freq: Once | ORAL | Status: DC
  Filled 2017-05-09: qty 296

## 2017-05-09 MED ORDER — PROPOFOL 10 MG/ML IV BOLUS
INTRAVENOUS | Status: DC | PRN
  Administered 2017-05-09: 200 mg via INTRAVENOUS

## 2017-05-09 MED ORDER — CELECOXIB 200 MG PO CAPS
200.0000 mg | ORAL_CAPSULE | Freq: Two times a day (BID) | ORAL | Status: DC
  Administered 2017-05-09 – 2017-05-11 (×4): 200 mg via ORAL
  Filled 2017-05-09 (×4): qty 1

## 2017-05-09 MED ORDER — ADULT MULTIVITAMIN W/MINERALS CH
1.0000 | ORAL_TABLET | Freq: Every day | ORAL | Status: DC
  Administered 2017-05-10 – 2017-05-11 (×2): 1 via ORAL
  Filled 2017-05-09 (×3): qty 1

## 2017-05-09 MED ORDER — FENTANYL CITRATE (PF) 250 MCG/5ML IJ SOLN
INTRAMUSCULAR | Status: AC
  Filled 2017-05-09: qty 5

## 2017-05-09 MED ORDER — ACETAMINOPHEN 10 MG/ML IV SOLN
1000.0000 mg | Freq: Four times a day (QID) | INTRAVENOUS | Status: AC
  Administered 2017-05-09 (×2): 1000 mg via INTRAVENOUS
  Filled 2017-05-09: qty 100

## 2017-05-09 MED ORDER — LIDOCAINE 2% (20 MG/ML) 5 ML SYRINGE
INTRAMUSCULAR | Status: DC | PRN
  Administered 2017-05-09: 100 mg via INTRAVENOUS

## 2017-05-09 MED ORDER — ROCURONIUM BROMIDE 10 MG/ML (PF) SYRINGE
PREFILLED_SYRINGE | INTRAVENOUS | Status: DC | PRN
  Administered 2017-05-09 (×2): 20 mg via INTRAVENOUS
  Administered 2017-05-09: 10 mg via INTRAVENOUS
  Administered 2017-05-09 (×2): 20 mg via INTRAVENOUS
  Administered 2017-05-09: 50 mg via INTRAVENOUS

## 2017-05-09 MED ORDER — PHENYLEPHRINE HCL 10 MG/ML IJ SOLN
INTRAVENOUS | Status: DC | PRN
  Administered 2017-05-09: 25 ug/min via INTRAVENOUS
  Administered 2017-05-09: 10:00:00 via INTRAVENOUS

## 2017-05-09 MED ORDER — FENTANYL CITRATE (PF) 250 MCG/5ML IJ SOLN
INTRAMUSCULAR | Status: DC | PRN
  Administered 2017-05-09: 50 ug via INTRAVENOUS
  Administered 2017-05-09: 100 ug via INTRAVENOUS
  Administered 2017-05-09: 150 ug via INTRAVENOUS

## 2017-05-09 MED ORDER — SUGAMMADEX SODIUM 200 MG/2ML IV SOLN
INTRAVENOUS | Status: DC | PRN
  Administered 2017-05-09: 500 mg via INTRAVENOUS

## 2017-05-09 MED ORDER — MEPERIDINE HCL 50 MG/ML IJ SOLN: 6.2500 mg | INTRAMUSCULAR | Status: DC | PRN

## 2017-05-09 MED ORDER — HYDROMORPHONE HCL 2 MG/ML IJ SOLN
INTRAMUSCULAR | Status: AC
  Filled 2017-05-09: qty 1

## 2017-05-09 MED ORDER — LACTATED RINGERS IV SOLN
INTRAVENOUS | Status: DC | PRN
  Administered 2017-05-09 (×2): via INTRAVENOUS

## 2017-05-09 MED ORDER — MIDAZOLAM HCL 2 MG/2ML IJ SOLN
INTRAMUSCULAR | Status: DC | PRN
  Administered 2017-05-09: 2 mg via INTRAVENOUS

## 2017-05-09 MED ORDER — GABAPENTIN 300 MG PO CAPS
300.0000 mg | ORAL_CAPSULE | ORAL | Status: AC
  Administered 2017-05-09: 300 mg via ORAL
  Filled 2017-05-09: qty 1

## 2017-05-09 MED ORDER — LIDOCAINE IN D5W 4-5 MG/ML-% IV SOLN
1.0000 mg/min | INTRAVENOUS | Status: AC
  Administered 2017-05-09: 25 ug/kg/min via INTRAVENOUS
  Filled 2017-05-09: qty 500

## 2017-05-09 MED ORDER — 0.9 % SODIUM CHLORIDE (POUR BTL) OPTIME
TOPICAL | Status: DC | PRN
  Administered 2017-05-09: 1000 mL
  Administered 2017-05-09: 3000 mL
  Administered 2017-05-09: 1000 mL

## 2017-05-09 MED ORDER — ONDANSETRON HCL 4 MG/2ML IJ SOLN
INTRAMUSCULAR | Status: DC | PRN
  Administered 2017-05-09: 4 mg via INTRAVENOUS

## 2017-05-09 MED ORDER — ENOXAPARIN SODIUM 40 MG/0.4ML ~~LOC~~ SOLN
40.0000 mg | SUBCUTANEOUS | Status: DC
  Administered 2017-05-10 – 2017-05-11 (×2): 40 mg via SUBCUTANEOUS
  Filled 2017-05-09 (×2): qty 0.4

## 2017-05-09 MED ORDER — PHENYLEPHRINE HCL 10 MG/ML IJ SOLN
INTRAMUSCULAR | Status: AC
  Filled 2017-05-09: qty 1

## 2017-05-09 MED ORDER — ONDANSETRON HCL 4 MG PO TABS: 4.0000 mg | ORAL_TABLET | Freq: Four times a day (QID) | ORAL | Status: DC | PRN

## 2017-05-09 MED ORDER — PHENYLEPHRINE 40 MCG/ML (10ML) SYRINGE FOR IV PUSH (FOR BLOOD PRESSURE SUPPORT)
PREFILLED_SYRINGE | INTRAVENOUS | Status: DC | PRN
  Administered 2017-05-09: 40 ug via INTRAVENOUS
  Administered 2017-05-09: 80 ug via INTRAVENOUS

## 2017-05-09 MED ORDER — CEFAZOLIN SODIUM-DEXTROSE 2-4 GM/100ML-% IV SOLN
2.0000 g | INTRAVENOUS | Status: AC
  Administered 2017-05-09: 2 g via INTRAVENOUS
  Filled 2017-05-09: qty 100

## 2017-05-09 MED ORDER — ONDANSETRON HCL 4 MG/2ML IJ SOLN: 4.0000 mg | Freq: Once | INTRAMUSCULAR | Status: DC | PRN

## 2017-05-09 MED ORDER — ROCURONIUM BROMIDE 50 MG/5ML IV SOLN
INTRAVENOUS | Status: AC
  Filled 2017-05-09: qty 1

## 2017-05-09 MED ORDER — INSULIN ASPART 100 UNIT/ML ~~LOC~~ SOLN
0.0000 [IU] | Freq: Three times a day (TID) | SUBCUTANEOUS | Status: DC
  Administered 2017-05-09 – 2017-05-11 (×6): 3 [IU] via SUBCUTANEOUS

## 2017-05-09 MED ORDER — ACETAMINOPHEN 10 MG/ML IV SOLN
INTRAVENOUS | Status: AC
  Filled 2017-05-09: qty 100

## 2017-05-09 MED ORDER — ACETAMINOPHEN 500 MG PO TABS
1000.0000 mg | ORAL_TABLET | ORAL | Status: AC
  Administered 2017-05-09: 1000 mg via ORAL
  Filled 2017-05-09: qty 2

## 2017-05-09 SURGICAL SUPPLY — 45 items
BINDER ABDOMINAL 12 ML 46-62 (SOFTGOODS) ×2 IMPLANT
BLADE 11 SAFETY STRL DISP (BLADE) ×2 IMPLANT
BLADE CLIPPER SURG (BLADE) ×2 IMPLANT
COVER SURGICAL LIGHT HANDLE (MISCELLANEOUS) ×4 IMPLANT
DEVICE TROCAR PUNCTURE CLOSURE (ENDOMECHANICALS) ×2 IMPLANT
DRAIN CHANNEL 19F RND (DRAIN) ×2 IMPLANT
DRAPE INCISE IOBAN 66X45 STRL (DRAPES) ×4 IMPLANT
DRAPE LAPAROSCOPIC ABDOMINAL (DRAPES) ×2 IMPLANT
ELECT CAUTERY BLADE 6.4 (BLADE) ×2 IMPLANT
ELECT REM PT RETURN 9FT ADLT (ELECTROSURGICAL) ×2
ELECTRODE REM PT RTRN 9FT ADLT (ELECTROSURGICAL) ×1 IMPLANT
EVACUATOR SILICONE 100CC (DRAIN) ×2 IMPLANT
GAUZE SPONGE 4X4 12PLY STRL (GAUZE/BANDAGES/DRESSINGS) ×2 IMPLANT
GLOVE BIO SURGEON STRL SZ7.5 (GLOVE) ×6 IMPLANT
GLOVE BIOGEL PI IND STRL 7.5 (GLOVE) ×2 IMPLANT
GLOVE BIOGEL PI IND STRL 8 (GLOVE) ×1 IMPLANT
GLOVE BIOGEL PI INDICATOR 7.5 (GLOVE) ×2
GLOVE BIOGEL PI INDICATOR 8 (GLOVE) ×1
GOWN STRL REUS W/ TWL LRG LVL3 (GOWN DISPOSABLE) ×3 IMPLANT
GOWN STRL REUS W/ TWL XL LVL3 (GOWN DISPOSABLE) ×1 IMPLANT
GOWN STRL REUS W/TWL LRG LVL3 (GOWN DISPOSABLE) ×3
GOWN STRL REUS W/TWL XL LVL3 (GOWN DISPOSABLE) ×1
KIT BASIN OR (CUSTOM PROCEDURE TRAY) ×2 IMPLANT
KIT TURNOVER KIT B (KITS) ×2 IMPLANT
MESH ULTRAPRO 12X12 30X30CM (Mesh General) ×2 IMPLANT
NS IRRIG 1000ML POUR BTL (IV SOLUTION) ×8 IMPLANT
PACK GENERAL/GYN (CUSTOM PROCEDURE TRAY) ×2 IMPLANT
PAD ARMBOARD 7.5X6 YLW CONV (MISCELLANEOUS) ×2 IMPLANT
RETAINER VISCERA MED (MISCELLANEOUS) ×2 IMPLANT
SPONGE LAP 18X18 5 PK (GAUZE/BANDAGES/DRESSINGS) ×4 IMPLANT
STAPLER VISISTAT 35W (STAPLE) ×2 IMPLANT
STRIP CLOSURE SKIN 1/2X4 (GAUZE/BANDAGES/DRESSINGS) ×2 IMPLANT
SUT ETHILON 3 0 FSL (SUTURE) ×2 IMPLANT
SUT NOVA NAB DX-16 0-1 5-0 T12 (SUTURE) ×4 IMPLANT
SUT NOVA NAB GS-21 0 18 T12 DT (SUTURE) IMPLANT
SUT PDS AB 0 CT 36 (SUTURE) IMPLANT
SUT PDS AB 1 CT  36 (SUTURE) ×4
SUT PDS AB 1 CT 36 (SUTURE) ×4 IMPLANT
SUT SILK 2 0 (SUTURE) ×1
SUT SILK 2-0 18XBRD TIE 12 (SUTURE) ×1 IMPLANT
SUT VIC AB 3-0 SH 18 (SUTURE) ×4 IMPLANT
TAPE CLOTH SURG 6X10 WHT LF (GAUZE/BANDAGES/DRESSINGS) ×2 IMPLANT
TAPE MEASURE VINYL STERILE (MISCELLANEOUS) ×2 IMPLANT
TOWEL OR 17X24 6PK STRL BLUE (TOWEL DISPOSABLE) ×2 IMPLANT
TRAY FOLEY CATH SILVER 16FR (SET/KITS/TRAYS/PACK) ×2 IMPLANT

## 2017-05-09 NOTE — Progress Notes (Signed)
Inpatient Diabetes Program Recommendations  AACE/ADA: New Consensus Statement on Inpatient Glycemic Control (2015)  Target Ranges:  Prepandial:   less than 140 mg/dL      Peak postprandial:   less than 180 mg/dL (1-2 hours)      Critically ill patients:  140 - 180 mg/dL   Results for TAVIS, KRING (MRN [id number]) as of 05/09/2017 10:05  Ref. Range 05/09/2017 05:46  Glucose-Capillary Latest Ref Range: 65 - 99 mg/dL 657 (H)  Results for CARLE, FENECH (MRN [id number]) as of 05/09/2017 10:05  Ref. Range 02/21/2017 17:13 05/02/2017 16:04  Hemoglobin A1C Latest Ref Range: 4.8 - 5.6 % 9.9 (H) 7.2 (H)   Review of Glycemic Control  Diabetes history: DM2 Outpatient Diabetes medications: Metformin 1000 mg BID Current orders for Inpatient glycemic control: None, In OR at this time  Inpatient Diabetes Program Recommendations: Correction (SSI): If patient is admitted following surgery, please consider ordering CBGs with Novolog 0-15 units TID with meals and Novolog 0-5 units QHS. HgbA1C: A1C 7.2% on 05/02/17 indicating an average glucose of 160 mg/dl.  Thanks, Orlando Penner, RN, MSN, CDE Diabetes Coordinator Inpatient Diabetes Program [phone] (Team Pager from 8am to 5pm)
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
## 2017-05-09 NOTE — Anesthesia Postprocedure Evaluation (Signed)
Anesthesia Post Note  Patient: TRAETON BORDAS  Procedure(s) Performed: OPEN INCISIONAL HERNIA REPAIR WITH MESH -TAR PROCEDURE (N/A Abdomen) INSERTION OF MESH (N/A Abdomen)     Patient location during evaluation: PACU Anesthesia Type: General Level of consciousness: awake and alert Pain management: pain level controlled Vital Signs Assessment: post-procedure vital signs reviewed and stable Respiratory status: spontaneous breathing, nonlabored ventilation, respiratory function stable and patient connected to nasal cannula oxygen Cardiovascular status: blood pressure returned to baseline and stable Postop Assessment: no apparent nausea or vomiting Anesthetic complications: no    Last Vitals:  Vitals:   05/09/17 1235 05/09/17 1250  BP: 99/79 113/88  Pulse: 85 84  Resp: 14 15  Temp:  36.8 C  SpO2: 95% 95%    Last Pain:  Vitals:   05/09/17 1250  TempSrc:   PainSc: Asleep                 Mckinzie Saksa Rustin

## 2017-05-09 NOTE — Anesthesia Preprocedure Evaluation (Signed)
Anesthesia Evaluation  Patient identified by MRN, date of birth, ID band Patient awake    Reviewed: Allergy & Precautions, NPO status , Patient's Chart, lab work & pertinent test results  Airway Mallampati: I  TM Distance: >3 FB Neck ROM: Full    Dental   Pulmonary    Pulmonary exam normal        Cardiovascular hypertension, Pt. on medications Normal cardiovascular exam     Neuro/Psych    GI/Hepatic   Endo/Other  diabetes, Type 2, Oral Hypoglycemic Agents  Renal/GU      Musculoskeletal   Abdominal   Peds  Hematology   Anesthesia Other Findings   Reproductive/Obstetrics                             Anesthesia Physical Anesthesia Plan  ASA: II  Anesthesia Plan: General   Post-op Pain Management:    Induction: Intravenous  PONV Risk Score and Plan:   Airway Management Planned: Oral ETT  Additional Equipment:   Intra-op Plan:   Post-operative Plan: Extubation in OR  Informed Consent: I have reviewed the patients History and Physical, chart, labs and discussed the procedure including the risks, benefits and alternatives for the proposed anesthesia with the patient or authorized representative who has indicated his/her understanding and acceptance.     Plan Discussed with: CRNA and Surgeon  Anesthesia Plan Comments:         Anesthesia Quick Evaluation

## 2017-05-09 NOTE — Progress Notes (Signed)
Day of Surgery   Subjective/Chief Complaint: Pt doing well Some abd soreness   Objective: Vital signs in last 24 hours: Temp:  [97.6 F (36.4 C)-98.7 F (37.1 C)] 97.6 F (36.4 C) (05/06 1505) Pulse Rate:  [82-92] 82 (05/06 1505) Resp:  [13-23] 13 (05/06 1505) BP: (85-162)/(63-137) 123/86 (05/06 1505) SpO2:  [91 %-100 %] 95 % (05/06 1505) Weight:  [115.2 kg (254 lb)] 115.2 kg (254 lb) (05/06 0606) Last BM Date: 05/08/17  Intake/Output from previous day: No intake/output data recorded. Intake/Output this shift: Total I/O In: 1295 [P.O.:300; I.V.:900; Other:95] Out: 390 [Urine:150; Drains:90; Blood:150]  General appearance: alert and cooperative GI: soft, non-tender; bowel sounds normal; no masses,  no organomegaly  Lab Results:  No results for input(s): WBC, HGB, HCT, PLT in the last 72 hours. BMET No results for input(s): NA, K, CL, CO2, GLUCOSE, BUN, CREATININE, CALCIUM in the last 72 hours. PT/INR No results for input(s): LABPROT, INR in the last 72 hours. ABG No results for input(s): PHART, HCO3 in the last 72 hours.  Invalid input(s): PCO2, PO2  Studies/Results: No results found.  Anti-infectives: Anti-infectives (From admission, onward)   Start     Dose/Rate Route Frequency Ordered Stop   05/09/17 0550  ceFAZolin (ANCEF) IVPB 2g/100 mL premix     2 g 200 mL/hr over 30 Minutes Intravenous On call to O.R. 05/09/17 0550 05/09/17 0801      Assessment/Plan: s/p Procedure(s): OPEN INCISIONAL HERNIA REPAIR WITH MESH -TAR PROCEDURE (N/A) INSERTION OF MESH (N/A) Cont' cleard for tonight Try to mobilize to chair Pain control  LOS: 0 days    Mark Miles., Jed Limerick 05/09/2017

## 2017-05-09 NOTE — Anesthesia Procedure Notes (Signed)
Procedure Name: Intubation Performed by: Valda Favia, CRNA Pre-anesthesia Checklist: Patient identified, Emergency Drugs available, Suction available and Patient being monitored Patient Re-evaluated:Patient Re-evaluated prior to induction Oxygen Delivery Method: Circle System Utilized Preoxygenation: Pre-oxygenation with 100% oxygen Induction Type: IV induction Ventilation: Mask ventilation without difficulty and Oral airway inserted - appropriate to patient size Laryngoscope Size: Mac and 4 Grade View: Grade I Tube type: Oral Tube size: 7.5 mm Number of attempts: 1 Airway Equipment and Method: Stylet and Oral airway Placement Confirmation: ETT inserted through vocal cords under direct vision,  positive ETCO2 and breath sounds checked- equal and bilateral Secured at: 22 cm Tube secured with: Tape Dental Injury: Teeth and Oropharynx as per pre-operative assessment

## 2017-05-09 NOTE — Plan of Care (Signed)
  Problem: Clinical Measurements: Goal: Will remain free from infection Outcome: Progressing Goal: Cardiovascular complication will be avoided Outcome: Progressing   

## 2017-05-09 NOTE — Transfer of Care (Signed)
Immediate Anesthesia Transfer of Care Note  Patient: Mark Miles  Procedure(s) Performed: OPEN INCISIONAL HERNIA REPAIR WITH MESH -TAR PROCEDURE (N/A Abdomen) INSERTION OF MESH (N/A Abdomen)  Patient Location: PACU  Anesthesia Type:General  Level of Consciousness: drowsy  Airway & Oxygen Therapy: Patient Spontanous Breathing and Patient connected to nasal cannula oxygen  Post-op Assessment: Report given to RN and Post -op Vital signs reviewed and stable  Post vital signs: Reviewed and stable  Last Vitals:  Vitals Value Taken Time  BP 162/137 05/09/2017 11:19 AM  Temp    Pulse 87 05/09/2017 11:25 AM  Resp 17 05/09/2017 11:25 AM  SpO2 93 % 05/09/2017 11:25 AM  Vitals shown include unvalidated device data.  Last Pain:  Vitals:   05/09/17 0606  TempSrc:   PainSc: 0-No pain         Complications: No apparent anesthesia complications

## 2017-05-09 NOTE — Op Note (Signed)
05/09/2017  10:53 AM  PATIENT:  Mark Miles  52 y.o. male  PRE-OPERATIVE DIAGNOSIS:  INCISIONAL HERNIA, ADHESIONS  POST-OPERATIVE DIAGNOSIS:  INCISIONAL HERNIA, ADHESIONS  PROCEDURE:  Procedure(s): ADHESIOLYSIS OPEN INCISIONAL HERNIA REPAIR WITH MESH  With Musculocutaneous flap creation bilaterally (N/A) INSERTION OF MESH (N/A)  SURGEON:  Surgeon(s) and Role:    * Axel Filler, MD - Primary  ANESTHESIA:   general  EBL:  50cc   BLOOD ADMINISTERED:none  DRAINS: (19Fr) Jackson-Pratt drain(s) with closed bulb suction above the mesh placement   LOCAL MEDICATIONS USED:  NONE  SPECIMEN:  No Specimen  DISPOSITION OF SPECIMEN:  N/A  COUNTS:  YES  TOURNIQUET:  * No tourniquets in log *  DICTATION: .Dragon Dictation After the patient was consented he was taken back to the operating room placed supine position bilateral SCDs in place. After appropriate antibiotics were confirmed timeout was called and all facts verified.  The superficial scar was dissected off the anterior abdominal wall. This was discarded. Bovie cautery was used to maintain hemostasis and dissection was taken down to the anterior fascia midline. This was incised. The fascia was elevated up. The hernia sac was then bluntly entered. At this time I extended the incision at the fascia inferiorly. There were minimal adhesions to the hernia sac in the midline. This allowed [place] to extend the incision inferiorly to the extent of the hernia as well as superiorly.  At this time proceeded to lyse adhesions of the omentum to the anterior abdominal wall. Majority adhesions were in the right side of the abdomen, but there were omental adhesions to both sides.  These were taken down sharply and with cautery.  Adhesiolysis took approximately .    Once this was done the rectus sheath retracted medially. The inferior portion of the right tissues were then incised with electrocautery. I was able to dissect off the  posterior rectus sheath and its entirety. This was done to the extent of the hernia and encompassed the retropubis area up to the retroxiphoid area.   At this time a proximal 1 cm medial to the perforating vessels the transversus abdominis muscle was incised as was its fascia. We carried this dissection both inferiorly and superiorly. At this time the transversus abdominis muscle was then dissected laterally in a blunt fashion away from the rectus muscle.  This was done in a posterior component separation fashion. A right angle was used to initially started the dissection with electrocautery. This easily dissected laterally. This allowed advancement of the peritoneum medially. The peritoneal holes that were made were then reapproximated using a 2-0 Vicryl in a figure-of-eight fashion.   At this time proceeded to retract the left rectus sheath medially and the inferior portion of the rectus sheath was incised. There were minimal muscular adhesions in this area. This easily dissected away. Again approximately 1 cm medial to the perforating vessels to the transversus abdominis muscle was incised as was its fascia. Again we proceeded to dissect with a right angle both superiorly and inferiorly to release the transversus abdominis muscle.   At this time the transversus abdominis muscle was then dissected laterally in a blunt fashion away from the rectus muscle.  This was done in a posterior component separation fashion. At this time proceeded to bluntly dissect away the transverse abdominis muscle from the underlying peritoneum laterally.The peritoneal holes that were made were then reapproximated using a 2-0 Vicryl in a figure-of-eight fashion.   The superior and inferior portions of the  dissection were connected in this plane. This allowed [place] to bluntly dissect the preperitoneal space superior and inferiorly. This was carried approximately to 6 cm superior inferiorly. At this time using Allis clamps and  peritoneum was brought to the midline. This was easily approximated in the midline. At this time #1 PDS suture was used in a running standard fashion reapproximate the peritoneum in the midline.  There was no tension on the midline peritoneal closure.  At this time a piece of 30 x 30 cm UltraPro mesh was then trimmed and placed in the preperitoneal space in the "homeplate" like fashion. 0 Novafil sutures were used to tack the mesh. Fascial using Endo Close device 3 laterally. This was also done superior and inferior portion of the hernia 2 respectively. The mesh lay flat and the tension was called laterally on each side. At this time as it was used to approximate the mesh to the peritoneum.  At this time the 36 [place] Blake drains were placed over the mesh via stab incisions. This was brought out inferiorly in the left midclavicular line. These were secured using 3-0 nylon as interrupted fashion.  At this time the rectus fascia was reapproximated midline using #1 PDS in a standard running fashion. Again there was no tension on the fascia in the midline.  Redundant skin was than excised this measures approximately 10x10cm in size. This was discarded.  The skin was reapproximated using skin staples. The bilateral stab wounds were then dressed with Steri-Strips the midline skin was dressed with Steri-Strips gauze and tape. The bulb was charged on the JP drain.   Patient was awakened from general anesthesia and taken to recovery room stable condition.    PLAN OF CARE: Admit to inpatient   PATIENT DISPOSITION:  PACU - hemodynamically stable.   Delay start of Pharmacological VTE agent (>24hrs) due to surgical blood loss or risk of bleeding: yes

## 2017-05-10 ENCOUNTER — Encounter (HOSPITAL_COMMUNITY): Payer: Self-pay | Admitting: General Practice

## 2017-05-10 ENCOUNTER — Other Ambulatory Visit: Payer: Self-pay

## 2017-05-10 HISTORY — PX: INCISIONAL HERNIA REPAIR: SHX193

## 2017-05-10 LAB — CBC
HCT: 37 % — ABNORMAL LOW (ref 39.0–52.0)
Hemoglobin: 11.9 g/dL — ABNORMAL LOW (ref 13.0–17.0)
MCH: 28.2 pg (ref 26.0–34.0)
MCHC: 32.2 g/dL (ref 30.0–36.0)
MCV: 87.7 fL (ref 78.0–100.0)
PLATELETS: 157 10*3/uL (ref 150–400)
RBC: 4.22 MIL/uL (ref 4.22–5.81)
RDW: 14.8 % (ref 11.5–15.5)
WBC: 6.4 10*3/uL (ref 4.0–10.5)

## 2017-05-10 LAB — BASIC METABOLIC PANEL
Anion gap: 10 (ref 5–15)
BUN: 10 mg/dL (ref 6–20)
CALCIUM: 7.8 mg/dL — AB (ref 8.9–10.3)
CHLORIDE: 102 mmol/L (ref 101–111)
CO2: 25 mmol/L (ref 22–32)
CREATININE: 0.97 mg/dL (ref 0.61–1.24)
GFR calc Af Amer: >60 mL/min (ref >60)
GFR calc non Af Amer: >60 mL/min (ref >60)
Glucose, Bld: 149 mg/dL — ABNORMAL HIGH (ref 65–99)
Potassium: 3.7 mmol/L (ref 3.5–5.1)
SODIUM: 137 mmol/L (ref 135–145)

## 2017-05-10 LAB — GLUCOSE, CAPILLARY
GLUCOSE-CAPILLARY: 148 mg/dL — AB (ref 65–99)
GLUCOSE-CAPILLARY: 132 mg/dL — AB (ref 65–99)
Glucose-Capillary: 128 mg/dL — ABNORMAL HIGH (ref 65–99)
Glucose-Capillary: 149 mg/dL — ABNORMAL HIGH (ref 65–99)

## 2017-05-10 MED ORDER — ACETAMINOPHEN 325 MG PO TABS
650.0000 mg | ORAL_TABLET | Freq: Four times a day (QID) | ORAL | Status: DC | PRN
  Administered 2017-05-10 – 2017-05-11 (×3): 650 mg via ORAL
  Filled 2017-05-10 (×3): qty 2

## 2017-05-10 MED ORDER — KETOROLAC TROMETHAMINE 30 MG/ML IJ SOLN: 30.0000 mg | Freq: Four times a day (QID) | INTRAMUSCULAR | Status: DC | PRN

## 2017-05-10 MED ORDER — OXYCODONE HCL 5 MG PO TABS
5.0000 mg | ORAL_TABLET | Freq: Four times a day (QID) | ORAL | Status: DC | PRN
  Administered 2017-05-10: 5 mg via ORAL
  Filled 2017-05-10: qty 1

## 2017-05-10 MED ORDER — OXYCODONE HCL 5 MG PO TABS: 5.0000 mg | ORAL_TABLET | ORAL | Status: DC | PRN

## 2017-05-10 MED ORDER — TRAMADOL HCL 50 MG PO TABS
50.0000 mg | ORAL_TABLET | Freq: Four times a day (QID) | ORAL | Status: DC | PRN
  Administered 2017-05-11: 50 mg via ORAL
  Filled 2017-05-10 (×2): qty 1

## 2017-05-10 NOTE — Plan of Care (Signed)
  Problem: Education: Goal: Knowledge of General Education information will improve Outcome: Progressing   Problem: Health Behavior/Discharge Planning: Goal: Ability to manage health-related needs will improve Outcome: Progressing   Problem: Clinical Measurements: Goal: Will remain free from infection Outcome: Progressing Goal: Diagnostic test results will improve Outcome: Progressing

## 2017-05-10 NOTE — Progress Notes (Signed)
1 Day Post-Op   Subjective/Chief Complaint: PT doing well Some pain as expected Tol PO   Objective: Vital signs in last 24 hours: Temp:  [97.6 F (36.4 C)-100 F (37.8 C)] 98.5 F (36.9 C) (05/07 0600) Pulse Rate:  [82-108] 108 (05/07 0600) Resp:  [13-23] 17 (05/07 0205) BP: (85-162)/(63-137) 133/94 (05/07 0600) SpO2:  [91 %-100 %] 98 % (05/07 0600) Last BM Date: 05/08/17  Intake/Output from previous day: 05/06 0701 - 05/07 0700 In: 1835 [P.O.:540; I.V.:1100; IV Piggyback:100] Out: 1610 [Urine:1250; Drains:210; Blood:150] Intake/Output this shift: No intake/output data recorded.  General appearance: alert and cooperative GI: dressing intact, JP SS  Lab Results:  Recent Labs    05/10/17 0420  WBC 6.4  HGB 11.9*  HCT 37.0*  PLT 157   BMET Recent Labs    05/10/17 0420  NA 137  K 3.7  CL 102  CO2 25  GLUCOSE 149*  BUN 10  CREATININE 0.97  CALCIUM 7.8*   Assessment/Plan: s/p Procedure(s): OPEN INCISIONAL HERNIA REPAIR WITH MESH -TAR PROCEDURE (N/A) INSERTION OF MESH (N/A) Advance diet as tol today Ambulate in hall QID Pain control Home in next 1-2 days  LOS: 1 day    Marigene Ehlers., Jed Limerick 05/10/2017

## 2017-05-11 LAB — GLUCOSE, CAPILLARY
Glucose-Capillary: 141 mg/dL — ABNORMAL HIGH (ref 65–99)
Glucose-Capillary: 149 mg/dL — ABNORMAL HIGH (ref 65–99)

## 2017-05-11 MED ORDER — TRAMADOL HCL 50 MG PO TABS
50.0000 mg | ORAL_TABLET | Freq: Four times a day (QID) | ORAL | 0 refills | Status: AC | PRN
Start: 2017-05-11 — End: ?

## 2017-05-11 MED ORDER — POLYETHYLENE GLYCOL 3350 17 G PO PACK
34.0000 g | PACK | Freq: Every day | ORAL | Status: DC
  Administered 2017-05-11: 34 g via ORAL
  Filled 2017-05-11: qty 2

## 2017-05-11 NOTE — Discharge Summary (Signed)
Physician Discharge Summary  Patient ID: HEMI CHACKO MRN: [id number] DOB/AGE: [birth year] 54 y.o.  Admit date: 05/09/2017 Discharge date: 05/11/2017  Admission Diagnoses: Incisional hernia  Discharge Diagnoses:  Active Problems:   S/P hernia repair   Discharged Condition: good  Hospital Course: Patient is a 52 year old male with a large midline incisional hernia.  Patient underwent surgery on 05/09/2017.  Please see operative note for full details.  Postoperatively patient was sent to the floor.  He started a clear liquid diet and was placed on the rest protocol.  Patient had some pain on postop day 0 however this got better on postop day 1 and 2.  The day of discharge patient was ambulating well on his own with minimal amount of soreness.  Patient was tolerating regular diet.  Patient was otherwise ambulating well on his own, afebrile, deemed stable for discharge and discharged home.  Consults: None  Significant Diagnostic Studies: None  Treatments: surgery: As above  Discharge Exam: Blood pressure 136/84, pulse (!) 106, temperature 98.5 F (36.9 C), temperature source Oral, resp. rate 18, height  (1.854 m), weight 116 kg (255 lb 11.7 oz), SpO2 92 %. General appearance: alert and cooperative GI: soft, non-tender; bowel sounds normal; no masses,  no organomegaly and midline c/d/i  Disposition:    Allergies as of 05/11/2017   No Known Allergies     Medication List    TAKE these medications   amLODipine 10 MG tablet Commonly known as:  NORVASC Take 1 tablet (10 mg total) by mouth daily.   aspirin EC 81 MG tablet Take 1 tablet (81 mg total) by mouth daily.   atorvastatin 40 MG tablet Commonly known as:  LIPITOR Take 1 tablet (40 mg total) by mouth daily.   lisinopril 20 MG tablet Commonly known as:  PRINIVIL,ZESTRIL Take 1 tablet (20 mg total) by mouth daily.   metFORMIN 1000 MG tablet Commonly known as:  GLUCOPHAGE Take 1 tablet (1,000 mg total) by mouth 2  (two) times daily with a meal.   multivitamin with minerals Tabs tablet Take 1 tablet by mouth daily.   naproxen sodium 220 MG tablet Commonly known as:  ALEVE Take 220 mg by mouth 2 (two) times daily as needed (for pain.).   traMADol 50 MG tablet Commonly known as:  ULTRAM Take 1 tablet (50 mg total) by mouth every 6 (six) hours as needed for severe pain.        Signed: Marigene Ehlers., Cheyne Bungert 05/11/2017, 7:29 AM

## 2017-05-11 NOTE — Progress Notes (Signed)
2 Days Post-Op   Subjective/Chief Complaint: Getting up and ambulating well Soreness better today    Objective: Vital signs in last 24 hours: Temp:  [98.5 F (36.9 C)-100.9 F (38.3 C)] 98.5 F (36.9 C) (05/08 0451) Pulse Rate:  [106-115] 106 (05/08 0451) Resp:  [18-20] 18 (05/08 0451) BP: (119-136)/(77-88) 136/84 (05/08 0451) SpO2:  [91 %-93 %] 92 % (05/08 0451) Weight:  [116 kg (255 lb 11.7 oz)] 116 kg (255 lb 11.7 oz) (05/08 0500) Last BM Date: 05/08/17  Intake/Output from previous day: 05/07 0701 - 05/08 0700 In: 580 [P.O.:480; I.V.:100] Out: 590 [Urine:550; Drains:40] Intake/Output this shift: No intake/output data recorded.  General appearance: alert and cooperative GI: soft, non-tender; bowel sounds normal; no masses,  no organomegaly and inc c/d/i, with some SS drainage from midline, JP SS  Lab Results:  Recent Labs    05/10/17 0420  WBC 6.4  HGB 11.9*  HCT 37.0*  PLT 157   BMET Recent Labs    05/10/17 0420  NA 137  K 3.7  CL 102  CO2 25  GLUCOSE 149*  BUN 10  CREATININE 0.97  CALCIUM 7.8*   Assessment/Plan: s/p Procedure(s): OPEN INCISIONAL HERNIA REPAIR WITH MESH -TAR PROCEDURE (N/A) INSERTION OF MESH (N/A) Advance diet to Reg Plan for home later today mobilize  LOS: 2 days    Marigene Ehlers., [facility] 05/11/2017

## 2017-05-11 NOTE — Progress Notes (Signed)
Left JP drain removed as ordered by MD, pressure dressing applied and secured with abdominal binder

## 2017-05-11 NOTE — Progress Notes (Signed)
Pt discharged home with wife in stable condition after going over discharge teaching with no concerns voiced. AVS and discharge script given before leaving unit

## 2017-05-11 NOTE — Discharge Instructions (Signed)
[facility] _______[facility] ° °HERNIA REPAIR: POST OP INSTRUCTIONS ° °Always review your discharge instruction sheet given to you by the facility where your surgery was performed. °IF YOU HAVE DISABILITY OR FAMILY LEAVE FORMS, YOU MUST BRING THEM TO THE OFFICE FOR PROCESSING.   °DO NOT GIVE THEM TO YOUR DOCTOR. ° °1. A  prescription for pain medication may be given to you upon discharge.  Take your pain medication as prescribed, if needed.  If narcotic pain medicine is not needed, then you may take acetaminophen (Tylenol) or ibuprofen (Advil) as needed. °2. Take your usually prescribed medications unless otherwise directed. °If you need a refill on your pain medication, please contact your pharmacy.  They will contact our office to request authorization. Prescriptions will not be filled after 5 pm or on week-ends. °3. You should follow a light diet the first 24 hours after arrival home, such as soup and crackers, etc.  Be sure to include lots of fluids daily.  Resume your normal diet the day after surgery. °4.Most patients will experience some swelling and bruising around the umbilicus or in the groin and scrotum.  Ice packs and reclining will help.  Swelling and bruising can take several days to resolve.  °6. It is common to experience some constipation if taking pain medication after surgery.  Increasing fluid intake and taking a stool softener (such as Colace) will usually help or prevent this problem from occurring.  A mild laxative (Milk of Magnesia or Miralax) should be taken according to package directions if there are no bowel movements after 48 hours. °7. Unless discharge instructions indicate otherwise, you may remove your bandages 24-48 hours after surgery, and you may shower at that time.  You may have steri-strips (small skin tapes) in place directly over the incision.  These strips should be left on the skin for 7-10 days.  If your surgeon used skin glue on the incision, you may shower in  24 hours.  The glue will flake off over the next 2-3 weeks.  Any sutures or staples will be removed at the office during your follow-up visit. °8. ACTIVITIES:  You may resume regular (light) daily activities beginning the next day--such as daily self-care, walking, climbing stairs--gradually increasing activities as tolerated.  You may have sexual intercourse when it is comfortable.  Refrain from any heavy lifting or straining until approved by your doctor. ° °a.You may drive when you are no longer taking prescription pain medication, you can comfortably wear a seatbelt, and you can safely maneuver your car and apply brakes. °b.RETURN TO WORK:   °_____________________________________________ ° °9.You should see your doctor in the office for a follow-up appointment approximately 2-3 weeks after your surgery.  Make sure that you call for this appointment within a day or two after you arrive home to insure a convenient appointment time. °10.OTHER INSTRUCTIONS: _________________________ °   _____________________________________ ° °WHEN TO CALL YOUR DOCTOR: °1. Fever over 101.0 °2. Inability to urinate °3. Nausea and/or vomiting °4. Extreme swelling or bruising °5. Continued bleeding from incision. °6. Increased pain, redness, or drainage from the incision ° °The clinic staff is available to answer your questions during regular business hours.  Please don’t hesitate to call and ask to speak to one of the nurses for clinical concerns.  If you have a medical emergency, go to the nearest emergency room or call 911.  A surgeon from [facility] is always on call at the hospital ° ° °[address], [address], [place], [place]  [address] ? ° [address] [address], [place], [place]   [address] °[phone] ? [phone] ? FAX [phone] °Web site: [email or web address] ° °

## 2017-05-11 NOTE — Progress Notes (Signed)
Pt felt nauseaus at this time. PRN Zofran given.

## 2017-05-31 ENCOUNTER — Other Ambulatory Visit: Payer: Self-pay | Admitting: Internal Medicine

## 2017-05-31 DIAGNOSIS — I10 Essential (primary) hypertension: Secondary | ICD-10-CM

## 2017-06-20 ENCOUNTER — Encounter (HOSPITAL_COMMUNITY): Payer: Self-pay | Admitting: General Surgery

## 2017-08-30 ENCOUNTER — Other Ambulatory Visit: Payer: Self-pay | Admitting: Internal Medicine

## 2017-08-30 DIAGNOSIS — I10 Essential (primary) hypertension: Secondary | ICD-10-CM

## 2017-09-28 ENCOUNTER — Encounter: Payer: Self-pay | Admitting: Family Medicine

## 2017-09-28 ENCOUNTER — Other Ambulatory Visit: Payer: Self-pay

## 2017-09-28 ENCOUNTER — Ambulatory Visit (INDEPENDENT_AMBULATORY_CARE_PROVIDER_SITE_OTHER): Payer: BLUE CROSS/BLUE SHIELD | Admitting: Family Medicine

## 2017-09-28 VITALS — BP 140/85 | HR 88 | Temp 98.4°F | Wt 244.4 lb

## 2017-09-28 DIAGNOSIS — Z23 Encounter for immunization: Secondary | ICD-10-CM | POA: Diagnosis not present

## 2017-09-28 DIAGNOSIS — E119 Type 2 diabetes mellitus without complications: Secondary | ICD-10-CM | POA: Diagnosis not present

## 2017-09-28 LAB — POCT GLYCOSYLATED HEMOGLOBIN (HGB A1C): HBA1C, POC (CONTROLLED DIABETIC RANGE): 5.8 % (ref 0.0–7.0)

## 2017-09-28 MED ORDER — GABAPENTIN 100 MG PO CAPS: 100.0000 mg | ORAL_CAPSULE | Freq: Three times a day (TID) | ORAL | 2 refills | Status: DC

## 2017-09-28 NOTE — Patient Instructions (Addendum)
It was great to meet you today! Thank you for letting me participate in your care!  Today, we discussed your diabetes and you are doing excellent! Keep up the great work!  I have decreased your Metformin from 2000mg  a day (two pills) to 1000mg  daily (one pill in the morning every day). We will recheck your A1c in 6 months to see if that is enough to control your blood sugar.  I have went a prescription for Gabapentin in to your pharmacy to help with your diabetic nerve pain. Start by using it once per day at night and you can gradually increase it to 3 times per day (one pill with each meal). It may make you sleepy at first but you should adjust soon and it is usually tolerated very well.  I also would like for you to check your blood sugar once a week until our next meeting, write them down, and bring them with you to our next appointment.  I would like you to check your blood pressure at home at least 3 times per week, keep a log and also bring that with you to your next appointment.   Please ensure you get your eyes checked for any potential complications for your diabetes.  Be well, Jules Schick, DO PGY-2, [facility]

## 2017-09-28 NOTE — Progress Notes (Signed)
     Subjective: Chief Complaint  Patient presents with  . Diabetes    HPI: Mark Miles is a 52 y.o. presenting to clinic today to discuss the following:  T2DM Patient presents today for DM follow up. He has been taking his medications as prescribed, walking every day, and avoiding fried foods and sweets. He states he has been feeling well but is still having some left leg numbness and tingling at various times. Otherwise, no complaints.   Health Maintenance: influenza vaccine, foot exam     ROS noted in HPI.   Past Medical, Surgical, Social, and Family History Reviewed & Updated per EMR.   Pertinent Historical Findings include:   Social History   Tobacco Use  Smoking Status Never Smoker  Smokeless Tobacco Never Used    Objective: BP 140/85 (BP Location: Right Arm)   Pulse 88   Temp 98.4 F (36.9 C) (Oral)   Wt 244 lb 6.4 oz (110.9 kg)   SpO2 99%   BMI 32.24 kg/m  Vitals and nursing notes reviewed  Physical Exam Gen: Alert and Oriented x 3, NAD HEENT: Normocephalic, atraumatic CV: RRR, no murmurs, normal S1, S2 split Resp: CTAB, no wheezing, rales, or rhonchi, comfortable work of breathing MSK: Moves all four extremities Ext: no clubbing, cyanosis, or edema; diabetic foot performed and documented in quality metrics Neuro: No gross deficits Skin: warm, dry, intact, no rashes  Results for orders placed or performed in visit on 09/28/17 (from the past 72 hour(s))  HgB A1c     Status: None   Collection Time: 09/28/17  3:23 PM  Result Value Ref Range   Hemoglobin A1C     HbA1c POC (<> result, manual entry)     HbA1c, POC (prediabetic range)     HbA1c, POC (controlled diabetic range) 5.8 0.0 - 7.0 %    Assessment/Plan:  DM2 (diabetes mellitus, type 2) (HCC) Diabetes well controlled with A1c of 5.8% today. - Reduce Metformin to 1000mg  daily, patient understands if A1c increases at next appointment we will go back to 2000mg  - Foot exam performed today,  he has eye exam scheduled - Cont diet and exercise; follow up in 6 months  Need for immunization against influenza Annual Influenza vaccine given today   PATIENT EDUCATION PROVIDED: See AVS    Diagnosis and plan along with any newly prescribed medication(s) were discussed in detail with this patient today. The patient verbalized understanding and agreed with the plan. Patient advised if symptoms worsen return to clinic or ER.   Health Maintainance:   Orders Placed This Encounter  Procedures  . Flu Vaccine QUAD 36+ mos IM  . HgB A1c    Meds ordered this encounter  Medications  . gabapentin (NEURONTIN) 100 MG capsule    Sig: Take 1 capsule (100 mg total) by mouth 3 (three) times daily.    Dispense:  120 capsule    Refill:  2     Jules Schick, DO 09/28/2017, 3:26 PM PGY-2 [facility]

## 2017-09-29 ENCOUNTER — Other Ambulatory Visit: Payer: Self-pay | Admitting: Family Medicine

## 2017-09-29 DIAGNOSIS — Z23 Encounter for immunization: Secondary | ICD-10-CM | POA: Insufficient documentation

## 2017-09-29 NOTE — Assessment & Plan Note (Signed)
Annual Influenza vaccine given today

## 2017-09-29 NOTE — Assessment & Plan Note (Signed)
Diabetes well controlled with A1c of 5.8% today. - Reduce Metformin to 1000mg  daily, patient understands if A1c increases at next appointment we will go back to 2000mg  - Foot exam performed today, he has eye exam scheduled - Cont diet and exercise; follow up in 6 months

## 2017-11-04 ENCOUNTER — Other Ambulatory Visit: Payer: Self-pay

## 2017-11-04 DIAGNOSIS — I10 Essential (primary) hypertension: Secondary | ICD-10-CM

## 2017-11-04 MED ORDER — LISINOPRIL 20 MG PO TABS: 20.0000 mg | ORAL_TABLET | Freq: Every day | ORAL | 3 refills | Status: DC

## 2017-11-22 ENCOUNTER — Other Ambulatory Visit: Payer: Self-pay | Admitting: Family Medicine

## 2017-11-22 DIAGNOSIS — I10 Essential (primary) hypertension: Secondary | ICD-10-CM

## 2018-02-06 ENCOUNTER — Telehealth: Payer: Self-pay

## 2018-02-06 ENCOUNTER — Ambulatory Visit: Payer: BLUE CROSS/BLUE SHIELD | Admitting: Family Medicine

## 2018-02-06 NOTE — Telephone Encounter (Signed)
Pt called nurse line stating his apt for his medications was today, however his insurance is no longer accepted here, and per patient the apt was canceled. Pt is requesting a refill on his medications while he looks for a new MD. Please advise.

## 2018-02-07 ENCOUNTER — Other Ambulatory Visit: Payer: Self-pay | Admitting: Family Medicine

## 2018-02-07 DIAGNOSIS — E119 Type 2 diabetes mellitus without complications: Secondary | ICD-10-CM

## 2018-02-07 DIAGNOSIS — I10 Essential (primary) hypertension: Secondary | ICD-10-CM

## 2018-02-07 MED ORDER — METFORMIN HCL 1000 MG PO TABS: 1000.0000 mg | ORAL_TABLET | Freq: Two times a day (BID) | ORAL | 3 refills | Status: AC

## 2018-02-07 MED ORDER — LISINOPRIL 20 MG PO TABS: 20.0000 mg | ORAL_TABLET | Freq: Every day | ORAL | 3 refills | Status: AC

## 2018-02-07 MED ORDER — GABAPENTIN 100 MG PO CAPS: 100.0000 mg | ORAL_CAPSULE | Freq: Three times a day (TID) | ORAL | 2 refills | Status: DC

## 2018-02-07 MED ORDER — ATORVASTATIN CALCIUM 40 MG PO TABS: 40.0000 mg | ORAL_TABLET | Freq: Every day | ORAL | 11 refills | Status: DC

## 2018-02-07 MED ORDER — AMLODIPINE BESYLATE 10 MG PO TABS: 10.0000 mg | ORAL_TABLET | Freq: Every day | ORAL | 3 refills | Status: DC

## 2018-02-07 NOTE — Telephone Encounter (Signed)
VM left informing patient of this.

## 2018-02-07 NOTE — Progress Notes (Signed)
Refilling medications per Epic message request.

## 2018-11-23 ENCOUNTER — Emergency Department (HOSPITAL_COMMUNITY)
Admission: EM | Admit: 2018-11-23 | Discharge: 2018-11-23 | Discharge status: Against Medical Advice | Payer: BLUE CROSS/BLUE SHIELD | Attending: Emergency Medicine | Admitting: Emergency Medicine

## 2018-11-23 ENCOUNTER — Other Ambulatory Visit: Payer: Self-pay

## 2018-11-23 DIAGNOSIS — R739 Hyperglycemia, unspecified: Secondary | ICD-10-CM | POA: Diagnosis present

## 2018-11-23 DIAGNOSIS — Z5321 Procedure and treatment not carried out due to patient leaving prior to being seen by health care provider: Secondary | ICD-10-CM | POA: Insufficient documentation

## 2018-11-23 LAB — URINALYSIS, ROUTINE W REFLEX MICROSCOPIC
Bacteria, UA: NONE SEEN
Bilirubin Urine: NEGATIVE
Glucose, UA: >=500 mg/dL — AB
Hgb urine dipstick: NEGATIVE
Ketones, ur: 20 mg/dL — AB
Leukocytes,Ua: NEGATIVE
Nitrite: NEGATIVE
Protein, ur: NEGATIVE mg/dL
Specific Gravity, Urine: 1.028 (ref 1.005–1.030)
pH: 5 (ref 5.0–8.0)

## 2018-11-23 LAB — CBC
HCT: 42.3 % (ref 39.0–52.0)
Hemoglobin: 14.1 g/dL (ref 13.0–17.0)
MCH: 29.1 pg (ref 26.0–34.0)
MCHC: 33.3 g/dL (ref 30.0–36.0)
MCV: 87.2 fL (ref 80.0–100.0)
Platelets: 183 10*3/uL (ref 150–400)
RBC: 4.85 MIL/uL (ref 4.22–5.81)
RDW: 13.1 % (ref 11.5–15.5)
WBC: 3.8 10*3/uL — ABNORMAL LOW (ref 4.0–10.5)
nRBC: 0 % (ref 0.0–0.2)

## 2018-11-23 LAB — BASIC METABOLIC PANEL
Anion gap: 12 (ref 5–15)
BUN: 14 mg/dL (ref 6–20)
CO2: 21 mmol/L — ABNORMAL LOW (ref 22–32)
Calcium: 9 mg/dL (ref 8.9–10.3)
Chloride: 100 mmol/L (ref 98–111)
Creatinine, Ser: 1.26 mg/dL — ABNORMAL HIGH (ref 0.61–1.24)
GFR calc Af Amer: >60 mL/min (ref >60)
GFR calc non Af Amer: >60 mL/min (ref >60)
Glucose, Bld: 379 mg/dL — ABNORMAL HIGH (ref 70–99)
Potassium: 4 mmol/L (ref 3.5–5.1)
Sodium: 133 mmol/L — ABNORMAL LOW (ref 135–145)

## 2018-11-23 LAB — CBG MONITORING, ED: Glucose-Capillary: 362 mg/dL — ABNORMAL HIGH (ref 70–99)

## 2018-11-23 NOTE — ED Triage Notes (Signed)
Pt here for hyperglycemia and polyuria, today cbg up to 500s. Pt takes metformin, taken off insulin. Pt denies n/v.

## 2018-11-23 NOTE — ED Notes (Signed)
Pt stated he is tired of waiting and he is going to go see his doctor next week.

## 2019-03-08 ENCOUNTER — Other Ambulatory Visit: Payer: Self-pay | Admitting: Family Medicine

## 2019-03-08 DIAGNOSIS — I10 Essential (primary) hypertension: Secondary | ICD-10-CM

## 2019-04-05 ENCOUNTER — Other Ambulatory Visit: Payer: Self-pay | Admitting: Family Medicine

## 2019-04-30 ENCOUNTER — Other Ambulatory Visit: Payer: Self-pay | Admitting: Family Medicine

## 2019-05-02 ENCOUNTER — Other Ambulatory Visit: Payer: Self-pay | Admitting: Family Medicine

## 2019-06-05 ENCOUNTER — Other Ambulatory Visit: Payer: Self-pay | Admitting: Family Medicine

## 2019-06-05 DIAGNOSIS — I10 Essential (primary) hypertension: Secondary | ICD-10-CM

## 2019-07-05 ENCOUNTER — Other Ambulatory Visit: Payer: Self-pay | Admitting: Family Medicine

## 2019-08-11 ENCOUNTER — Other Ambulatory Visit: Payer: Self-pay | Admitting: Family Medicine

## 2019-08-27 ENCOUNTER — Other Ambulatory Visit: Payer: Self-pay | Admitting: Family Medicine

## 2019-09-05 ENCOUNTER — Other Ambulatory Visit: Payer: Self-pay | Admitting: Family Medicine

## 2019-09-05 DIAGNOSIS — I10 Essential (primary) hypertension: Secondary | ICD-10-CM

## 2022-12-16 ENCOUNTER — Other Ambulatory Visit: Payer: Self-pay

## 2022-12-16 ENCOUNTER — Emergency Department (HOSPITAL_BASED_OUTPATIENT_CLINIC_OR_DEPARTMENT_OTHER)
Admission: EM | Admit: 2022-12-16 | Discharge: 2022-12-16 | Disposition: A | Discharge status: Home / Self Care | Payer: 59 | Attending: Emergency Medicine | Admitting: Emergency Medicine

## 2022-12-16 ENCOUNTER — Encounter (HOSPITAL_BASED_OUTPATIENT_CLINIC_OR_DEPARTMENT_OTHER): Payer: Self-pay | Admitting: Urology

## 2022-12-16 DIAGNOSIS — M79604 Pain in right leg: Secondary | ICD-10-CM | POA: Insufficient documentation

## 2022-12-16 DIAGNOSIS — M79605 Pain in left leg: Secondary | ICD-10-CM | POA: Diagnosis not present

## 2022-12-16 LAB — CBC WITH DIFFERENTIAL/PLATELET
Abs Immature Granulocytes: 0.01 10*3/uL (ref 0.00–0.07)
Basophils Absolute: 0 10*3/uL (ref 0.0–0.1)
Basophils Relative: 1 %
Eosinophils Absolute: 0 10*3/uL (ref 0.0–0.5)
Eosinophils Relative: 1 %
HCT: 43.5 % (ref 39.0–52.0)
Hemoglobin: 14.5 g/dL (ref 13.0–17.0)
Immature Granulocytes: 0 %
Lymphocytes Relative: 35 %
Lymphs Abs: 1.5 10*3/uL (ref 0.7–4.0)
MCH: 30.4 pg (ref 26.0–34.0)
MCHC: 33.3 g/dL (ref 30.0–36.0)
MCV: 91.2 fL (ref 80.0–100.0)
Monocytes Absolute: 0.4 10*3/uL (ref 0.1–1.0)
Monocytes Relative: 8 %
Neutro Abs: 2.4 10*3/uL (ref 1.7–7.7)
Neutrophils Relative %: 55 %
Platelets: 209 10*3/uL (ref 150–400)
RBC: 4.77 MIL/uL (ref 4.22–5.81)
RDW: 12.7 % (ref 11.5–15.5)
WBC: 4.3 10*3/uL (ref 4.0–10.5)
nRBC: 0 % (ref 0.0–0.2)

## 2022-12-16 LAB — BASIC METABOLIC PANEL
Anion gap: 10 (ref 5–15)
BUN: 14 mg/dL (ref 6–20)
CO2: 24 mmol/L (ref 22–32)
Calcium: 8.4 mg/dL — ABNORMAL LOW (ref 8.9–10.3)
Chloride: 101 mmol/L (ref 98–111)
Creatinine, Ser: 0.95 mg/dL (ref 0.61–1.24)
GFR, Estimated: >60 mL/min (ref >60)
Glucose, Bld: 117 mg/dL — ABNORMAL HIGH (ref 70–99)
Potassium: 3.4 mmol/L — ABNORMAL LOW (ref 3.5–5.1)
Sodium: 135 mmol/L (ref 135–145)

## 2022-12-16 LAB — CBG MONITORING, ED: Glucose-Capillary: 175 mg/dL — ABNORMAL HIGH (ref 70–99)

## 2022-12-16 MED ORDER — GABAPENTIN 100 MG PO CAPS: 100.0000 mg | ORAL_CAPSULE | Freq: Every day | ORAL | 0 refills | Status: AC

## 2022-12-16 MED ORDER — GABAPENTIN 100 MG PO CAPS
100.0000 mg | ORAL_CAPSULE | Freq: Once | ORAL | Status: AC
  Administered 2022-12-16: 100 mg via ORAL
  Filled 2022-12-16: qty 1

## 2022-12-16 MED ORDER — SODIUM CHLORIDE 0.9 % IV BOLUS
1000.0000 mL | Freq: Once | INTRAVENOUS | Status: AC
  Administered 2022-12-16: 1000 mL via INTRAVENOUS

## 2022-12-16 MED ORDER — KETOROLAC TROMETHAMINE 15 MG/ML IJ SOLN
15.0000 mg | Freq: Once | INTRAMUSCULAR | Status: AC
  Administered 2022-12-16: 15 mg via INTRAVENOUS
  Filled 2022-12-16: qty 1

## 2022-12-16 MED ORDER — MORPHINE SULFATE (PF) 4 MG/ML IV SOLN
4.0000 mg | Freq: Once | INTRAVENOUS | Status: AC
  Administered 2022-12-16: 4 mg via INTRAVENOUS
  Filled 2022-12-16: qty 1

## 2022-12-16 NOTE — ED Notes (Addendum)
Pt. Reports he feels like his feet are frozen.. Pt. Also reports he has pain in the lower part of his legs up to his hips at times.

## 2022-12-16 NOTE — ED Triage Notes (Signed)
Pt states leg and feet numbness, feet feel cold as well Started 2 days ago  Reports feels like Blood sugar is high  States increased urinary frequency  Takes metformin but it upsets stomach so he doesn't take it regularly  Takes gabapentin but had to quit taking it cause it made him sleepy at work   BS 175 at triage   H/o Diabetic Neuropathy

## 2022-12-16 NOTE — ED Provider Notes (Signed)
[place] [facility] Provider Note   CSN: [id number] Arrival date & time: 12/16/22  1508     History Chief Complaint  Patient presents with   Leg Pain    HPI Mark Miles is a 57 y.o. male presenting for bilateral lower extremity swelling and pain. He is a 57 year old male history of diabetes has not been taking his medication.  Known history of idiopathic neuropathy and supposed been gabapentin but states it makes him too tired to work so he has been holding this medication. He denies any active fevers or chills nausea vomiting syncope or shortness of breath.  He is ambulatory tolerating p.o. intake otherwise..   Patient's recorded medical, surgical, social, medication list and allergies were reviewed in the Snapshot window as part of the initial history.   Review of Systems   Review of Systems  Constitutional:  Negative for chills and fever.  HENT:  Negative for ear pain and sore throat.   Eyes:  Negative for pain and visual disturbance.  Respiratory:  Negative for cough and shortness of breath.   Cardiovascular:  Positive for leg swelling. Negative for chest pain and palpitations.  Gastrointestinal:  Negative for abdominal pain and vomiting.  Genitourinary:  Negative for dysuria and hematuria.  Musculoskeletal:  Negative for arthralgias and back pain.  Skin:  Negative for color change and rash.  Neurological:  Negative for seizures and syncope.  All other systems reviewed and are negative.   Physical Exam Updated Vital Signs BP (!) 140/108 (BP Location: Left Arm)   Pulse (!) 120   Temp 98.6 F (37 C) (Oral)   Resp 16   Ht 6\' 1"  (1.854 m)   Wt 110.9 kg   SpO2 98%   BMI 32.26 kg/m  Physical Exam Vitals and nursing note reviewed.  Constitutional:      General: He is not in acute distress.    Appearance: He is well-developed.  HENT:     Head: Normocephalic and atraumatic.  Eyes:     Conjunctiva/sclera: Conjunctivae  normal.  Cardiovascular:     Rate and Rhythm: Normal rate and regular rhythm.     Heart sounds: No murmur heard. Pulmonary:     Effort: Pulmonary effort is normal. No respiratory distress.     Breath sounds: Normal breath sounds.  Abdominal:     Palpations: Abdomen is soft.     Tenderness: There is no abdominal tenderness.  Musculoskeletal:        General: No swelling.     Cervical back: Neck supple.  Skin:    General: Skin is warm and dry.     Capillary Refill: Capillary refill takes less than 2 seconds.  Neurological:     Mental Status: He is alert.  Psychiatric:        Mood and Affect: Mood normal.      ED Course/ Medical Decision Making/ A&P    Procedures Procedures   Medications Ordered in ED Medications  ketorolac (TORADOL) 15 MG/ML injection 15 mg (has no administration in time range)  sodium chloride 0.9 % bolus 1,000 mL (0 mLs Intravenous Stopped 12/16/22 1808)  morphine (PF) 4 MG/ML injection 4 mg (4 mg Intravenous Given 12/16/22 1659)  gabapentin (NEURONTIN) capsule 100 mg (100 mg Oral Given 12/16/22 1642)    Medical Decision Making:   57 year old male presenting with lower extremity pain.  It is bilateral. Exam is grossly benign.  Initially tachycardic and sinus tachycardia though this improved on serial  vital signs after IV fluid. States had not had anything to eat or drink today.  Blood work performed to evaluate for hematologic or metabolic crisis and both of the studies are negative at this time for acute pathology. He is hyperglycemic today. I discussed the importance hyperglycemic and I discussed importance of following up with his PCP for better glycemic control. Likely underlying neuropathy causing symptoms.  Evidence of cellulitis or DVT on current exam.  Symptoms improving with current medical management patient stable for outpatient care and management.  Disposition:  I have considered need for hospitalization, however, considering all of the  above, I believe this patient is stable for discharge at this time.  Patient/family educated about specific return precautions for given chief complaint and symptoms.  Patient/family educated about follow-up with PCP.     Patient/family expressed understanding of return precautions and need for follow-up. Patient spoken to regarding all imaging and laboratory results and appropriate follow up for these results. All education provided in verbal form with additional information in written form. Time was allowed for answering of patient questions. Patient discharged.    Emergency Department Medication Summary:   Medications  ketorolac (TORADOL) 15 MG/ML injection 15 mg (has no administration in time range)  sodium chloride 0.9 % bolus 1,000 mL (0 mLs Intravenous Stopped 12/16/22 1808)  morphine (PF) 4 MG/ML injection 4 mg (4 mg Intravenous Given 12/16/22 1659)  gabapentin (NEURONTIN) capsule 100 mg (100 mg Oral Given 12/16/22 1642)        Clinical Impression:  1. Bilateral leg pain      Discharge   Final Clinical Impression(s) / ED Diagnoses Final diagnoses:  Bilateral leg pain    Rx / DC Orders ED Discharge Orders          Ordered    gabapentin (NEURONTIN) 100 MG capsule  Daily at bedtime        12/16/22 1831              Glyn Ade, MD 12/16/22 1831

## 2023-03-03 NOTE — Progress Notes (Signed)
 Subjective   HPI:  Mark Miles is a 58 y.o.  male who presents to the office with:  Chief Complaint  Patient presents with  . discuss medication    Patient stated that the gabapentin  isn't working expressed he takes 900mg  sometimes. Patient is having trouble sleeping due to pain in both legs from knees down to his feet. Patient requested something stronger.   . Gaps In Care    Eye exam: Patient needs a referral Vaccines: Agree and would like to discuss     Patient with a past medical history significant for peripheral neuropathy, bilateral sciatica with low back pain reports persistent and uncontrolled lower extremity pain.  He states that pain radiates from his bilateral feet to his knees.  Occasionally will radiate up into his buttocks into his hips and low back.  He reports that pain is worse after he has been standing for long periods of time.  He states that his work has been accommodating to help him take more frequent breaks.  He reports that he has been using gabapentin , increased dose of 600 mg with limited relief.  Patient also started using 600 mg at 11 AM and 300 mg around 5 PM before he goes to bed.  He states that it makes him feel drowsy but does not control the pain very well.  He would like to discuss an alternative medication.  Patient is curious about his low back pain and would like to have further evaluation as well.    PMH: Past medical history, Past surgical history, Social history, family history were reviewed as noted in EMR.  Pertinent for:  History reviewed. No pertinent past medical history.   Medications and allergies reviewed.  ROS: All systems were negative including constitutional, CV, respiratory, GI/GU except for those noted in the HPI.  Objective   Vital Signs: BP 99/63   Pulse 106   Temp 97.7 F (36.5 C)   Ht 6' 1 (1.854 m)   Wt 198 lb 6.4 oz (90 kg)   SpO2 97%   BMI 26.18 kg/m   Wt Readings from Last 3 Encounters:  03/03/23 198 lb 6.4 oz  (90 kg)  01/17/23 211 lb (95.7 kg)  12/28/22 206 lb (93.4 kg)   Physical Exam Vitals and nursing note reviewed.  Constitutional:      General: He is not in acute distress. HENT:     Head: Normocephalic and atraumatic.  Cardiovascular:     Rate and Rhythm: Normal rate and regular rhythm.     Heart sounds: No murmur heard. Pulmonary:     Effort: Pulmonary effort is normal.     Breath sounds: Normal breath sounds. No wheezing.  Musculoskeletal:     Lumbar back: Tenderness present. Decreased range of motion.     Comments: Faber: positive bilateral Straight Leg Raise: positive bilateral.  Skin:    General: Skin is warm and dry.  Neurological:     Mental Status: He is alert.     Motor: No weakness.     Gait: Gait normal.  Psychiatric:        Mood and Affect: Mood and affect normal.        Cognition and Memory: Memory normal.        Judgment: Judgment normal.      Assessment/Plan   Orders Placed This Encounter  Procedures  . Ambulatory referral to Neurosurgery    1. Type 2 diabetes mellitus with diabetic polyneuropathy, without long-term current use of insulin   (*)  2. Chronic right-sided low back pain with bilateral sciatica     Type 2 diabetes mellitus with diabetic polyneuropathy, without long-term current use of insulin   (*) History of poorly controlled type 2 diabetes.  At last check 6 weeks ago, A1c registered 7.9%.  Pain unfortunately is worsened and is poorly controlled with gabapentin .  Patient increased to 900 mg (600 mg at 11 AM, 300 mg at 5 PM) without relief.  We will switch patient to Lyrica 75 mg.  Possibly contributing with low back sciatica symptoms.  Initiate meloxicam  15 mg daily as needed.  Counseled on side effects and use of medication.  Encouraged walking regimen.  Begin monitoring glucose readings closely.  Follow-up again in 6 weeks.  Patient and I discussed again that his insurance is out of network.  He should consider finding a provider within  network to help assist with cost of visits, labs.  He will otherwise contact our office if he decides to follow-up after/13/25, time of his next A1c check.  Chronic right-sided low back pain with bilateral sciatica No red flags for cauda equina including saddle anesthesia, bowel or bladder incontinence.  Will introduce meloxicam  15 mg once daily as needed with food.  Counseled on side effects and use.  We will switch gabapentin  to Lyrica.  Patient will monitor symptoms closely.  I have referred patient to a spine specialist in [facility] area for his request.   Follow up in about 6 weeks (around 04/17/2023) for DM f/u.  No future appointments.  Medications at end of visit today:  Current Outpatient Medications:  .  atorvastatin  (LIPITOR) 40 mg tablet, Take one tablet (40 mg dose) by mouth daily., Disp: 90 tablet, Rfl: 2 .  losartan potassium (COZAAR) 50 mg tablet, Take one tablet (50 mg dose) by mouth daily., Disp: 30 tablet, Rfl: 3 .  meloxicam  (MOBIC ) 15 mg tablet, Take one tablet (15 mg dose) by mouth daily as needed for Pain (with food)., Disp: 30 tablet, Rfl: 0 .  metFORMIN  ER (GLUCOPHAGE -XR) 500 mg 24 hr tablet, Take two tablets (1,000 mg dose) by mouth 2 (two) times a day with meals., Disp: 120 tablet, Rfl: 3 .  pregabalin (LYRICA) 75 mg capsule, Take one capsule (75 mg dose) by mouth at bedtime. Max Daily Amount: 75 mg, Disp: 30 capsule, Rfl: 2  Patient Care Team: Niall Moreira, PA-C as PCP - General (Physician Assistant)

## 2023-06-13 NOTE — Progress Notes (Signed)
 Subjective   HPI:  Mark Miles is a 58 y.o.  male who presents to the office with:  Chief Complaint  Patient presents with  . Diabetes    States neuropathy is getting worse, wearing knee brace today.  OK with student.     Diabetes Follow-up  Hemoglobin A1c  Date Value Ref Range Status  06/13/2023 5.8 (A) 4.8 - 5.6 % Final  01/17/2023 7.9 (A) 4.8 - 5.6 % Final     Diabetic Medications     Biguanides     * metFORMIN  ER (GLUCOPHAGE -XR) 500 mg 24 hr tablet Take two tablets (1,000 mg dose) by mouth 2 (two) times a day with meals.       Health Maintenance  Topic Date Due  . Diabetes Eye Exam  Never done  . Diabetes Annual Microalbumin/Creatinine Ratio  10/17/2023  . Diabetes Foot Exam  10/17/2023  . Diabetes Hemoglobin A1C  12/13/2023  . Diabetes Lipid Profile  01/17/2024    Home glucometer readings are not performed.   Glucose levels are decreasing steadily.    Compliance with: diabetic diet: compliant most of the time    medication:   compliant all of the time    exercise:  noncompliant much of the time Body mass index is 25.07 kg/m. Wt Readings from Last 3 Encounters:  06/13/23 190 lb (86.2 kg)  03/24/23 165 lb (74.8 kg)  03/03/23 198 lb 6.4 oz (90 kg)    Diabetes symptoms:  no polyuria or polydipsia, no chest pain, dyspnea or TIAs, has dysesthesias in the feet  Known diabetic complications:  peripheral neuropathy     PMH: Past medical history, Past surgical history, Social history, family history were reviewed as noted in EMR.  Pertinent for:  History reviewed. No pertinent past medical history.   Medications and allergies reviewed.  ROS: All systems were negative including constitutional, CV, respiratory, GI/GU except for those noted in the HPI.  Objective   Vital Signs: BP 130/88   Pulse 104   Ht 6' 1 (1.854 m)   Wt 190 lb (86.2 kg)   SpO2 98%   BMI 25.07 kg/m   Wt Readings from Last 3 Encounters:  06/13/23 190 lb (86.2 kg)  03/24/23 165  lb (74.8 kg)  03/03/23 198 lb 6.4 oz (90 kg)   Physical Exam Vitals and nursing note reviewed.  Constitutional:      General: He is not in acute distress.    Appearance: He is not toxic-appearing.  HENT:     Head: Normocephalic and atraumatic.  Eyes:     Conjunctiva/sclera: Conjunctivae normal.  Pulmonary:     Effort: Pulmonary effort is normal. No respiratory distress.  Neurological:     Mental Status: He is alert and oriented to person, place, and time.     Cranial Nerves: No cranial nerve deficit or facial asymmetry.     Motor: No weakness or tremor.     Coordination: Coordination is intact.     Gait: Gait abnormal (guarded gait). Tandem walk normal.     Deep Tendon Reflexes:     Reflex Scores:      Patellar reflexes are 2+ on the right side and 2+ on the left side. Psychiatric:        Mood and Affect: Mood and affect normal.        Cognition and Memory: Memory normal.        Judgment: Judgment normal.      Assessment/Plan   Orders Placed This Encounter  Procedures  . Ambulatory referral to Neurology  . Ambulatory referral to Physical Therapy Evaluation and Treatment  . POCT Hemoglobin A1C    1. Type 2 diabetes mellitus with diabetic polyneuropathy, without long-term current use of insulin  (*)   2. Peripheral polyneuropathy   3. Frequent falls   4. Balance disorder     Type 2 diabetes mellitus with diabetic polyneuropathy, without long-term current use of insulin   (*) A1c improved further to 5.8%. Congratulated patient on reduction. Continue with Metformin  2000mg  daily. Continue efforts with diet/exercise. Will continue to monitor in 3 months.   Peripheral polyneuropathy  Persistent and worsening per patient's report. Likely secondary to DM2. Now well controlled but historically hyperglycemic. Patient is now noting balance issues and frequent falls. He did not schedule f/u with neuro spine team - recommended to contact ASAP.  In the interim I will also place a  referral for patient to neurology and PT given his worsening balance, frequent falls.  I discussed with patient a walking aid.  He states that he has been using a crutch at times.  Defers additional assistance at this time but can contact me if he has any changes or worsening.  Frequent falls  Persistent and worsening per patient's report. Likely secondary to DM2. Now well controlled but historically hyperglycemic. Patient is now noting balance issues and frequent falls. He did not schedule f/u with neuro spine team - recommended to contact ASAP.  In the interim I will also place a referral for patient to neurology and PT given his worsening balance, frequent falls.  I discussed with patient a walking aid.  He states that he has been using a crutch at times.  Defers additional assistance at this time but can contact me if he has any changes or worsening.   Balance disorder  Persistent and worsening per patient's report. Likely secondary to DM2. Now well controlled but historically hyperglycemic. Patient is now noting balance issues and frequent falls. He did not schedule f/u with neuro spine team - recommended to contact ASAP.  In the interim I will also place a referral for patient to neurology and PT given his worsening balance, frequent falls.  I discussed with patient a walking aid.  He states that he has been using a crutch at times.  Defers additional assistance at this time but can contact me if he has any changes or worsening.    Follow up in about 3 months (around 09/13/2023) for CPE w/ DM f/u.  Future Appointments  Date Time Provider Department Center  09/26/2023  3:30 PM Lannie Gell, PA-C [facility] None    Medications at end of visit today:  Current Outpatient Medications:  .  atorvastatin  (LIPITOR) 40 mg tablet, Take one tablet (40 mg dose) by mouth daily., Disp: 90 tablet, Rfl: 2 .  losartan potassium (COZAAR) 50 mg tablet, Take one tablet (50 mg dose) by mouth daily., Disp: 90 tablet,  Rfl: 2 .  meloxicam  (MOBIC ) 15 mg tablet, TAKE 1 TABLET BY MOUTH ONCE DAILY AS NEEDED WITH FOOD FOR PAIN, Disp: 30 tablet, Rfl: 0 .  metFORMIN  ER (GLUCOPHAGE -XR) 500 mg 24 hr tablet, Take two tablets (1,000 mg dose) by mouth 2 (two) times a day with meals., Disp: 120 tablet, Rfl: 3 .  pregabalin (LYRICA) 50 mg capsule, Take one capsule (50 mg dose) by mouth 3 (three) times a day for 30 days. Max Daily Amount: 150 mg, Disp: 90 capsule, Rfl: 0  Patient Care Team: Niall Moreira, PA-C  as PCP - General (Physician Assistant)    *Some images could not be shown.

## 2023-09-14 ENCOUNTER — Encounter (HOSPITAL_BASED_OUTPATIENT_CLINIC_OR_DEPARTMENT_OTHER): Payer: Self-pay

## 2023-09-14 ENCOUNTER — Emergency Department (HOSPITAL_BASED_OUTPATIENT_CLINIC_OR_DEPARTMENT_OTHER)
Admission: EM | Admit: 2023-09-14 | Discharge: 2023-09-14 | Disposition: A | Discharge status: Home / Self Care | Attending: Emergency Medicine | Admitting: Emergency Medicine

## 2023-09-14 DIAGNOSIS — D709 Neutropenia, unspecified: Secondary | ICD-10-CM | POA: Diagnosis not present

## 2023-09-14 DIAGNOSIS — Z7984 Long term (current) use of oral hypoglycemic drugs: Secondary | ICD-10-CM | POA: Insufficient documentation

## 2023-09-14 DIAGNOSIS — Z7982 Long term (current) use of aspirin: Secondary | ICD-10-CM | POA: Diagnosis not present

## 2023-09-14 DIAGNOSIS — Y92410 Unspecified street and highway as the place of occurrence of the external cause: Secondary | ICD-10-CM | POA: Insufficient documentation

## 2023-09-14 DIAGNOSIS — S161XXA Strain of muscle, fascia and tendon at neck level, initial encounter: Secondary | ICD-10-CM | POA: Insufficient documentation

## 2023-09-14 DIAGNOSIS — Z79899 Other long term (current) drug therapy: Secondary | ICD-10-CM | POA: Diagnosis not present

## 2023-09-14 DIAGNOSIS — E119 Type 2 diabetes mellitus without complications: Secondary | ICD-10-CM | POA: Diagnosis not present

## 2023-09-14 DIAGNOSIS — I10 Essential (primary) hypertension: Secondary | ICD-10-CM | POA: Diagnosis not present

## 2023-09-14 DIAGNOSIS — S199XXA Unspecified injury of neck, initial encounter: Secondary | ICD-10-CM | POA: Diagnosis present

## 2023-09-14 LAB — COMPREHENSIVE METABOLIC PANEL WITH GFR
ALT: 37 U/L (ref 0–44)
AST: 36 U/L (ref 15–41)
Albumin: 4.2 g/dL (ref 3.5–5.0)
Alkaline Phosphatase: 107 U/L (ref 38–126)
Anion gap: 18 — ABNORMAL HIGH (ref 5–15)
BUN: 15 mg/dL (ref 6–20)
CO2: 20 mmol/L — ABNORMAL LOW (ref 22–32)
Calcium: 8.5 mg/dL — ABNORMAL LOW (ref 8.9–10.3)
Chloride: 103 mmol/L (ref 98–111)
Creatinine, Ser: 0.98 mg/dL (ref 0.61–1.24)
GFR, Estimated: >60 mL/min (ref >60)
Glucose, Bld: 136 mg/dL — ABNORMAL HIGH (ref 70–99)
Potassium: 3.5 mmol/L (ref 3.5–5.1)
Sodium: 141 mmol/L (ref 135–145)
Total Bilirubin: 0.3 mg/dL (ref 0.0–1.2)
Total Protein: 6.9 g/dL (ref 6.5–8.1)

## 2023-09-14 LAB — CBC WITH DIFFERENTIAL/PLATELET
Abs Immature Granulocytes: 0 K/uL (ref 0.00–0.07)
Basophils Absolute: 0 K/uL (ref 0.0–0.1)
Basophils Relative: 0 %
Eosinophils Absolute: 0.1 K/uL (ref 0.0–0.5)
Eosinophils Relative: 2 %
HCT: 34.9 % — ABNORMAL LOW (ref 39.0–52.0)
Hemoglobin: 12 g/dL — ABNORMAL LOW (ref 13.0–17.0)
Immature Granulocytes: 0 %
Lymphocytes Relative: 38 %
Lymphs Abs: 1 K/uL (ref 0.7–4.0)
MCH: 31.3 pg (ref 26.0–34.0)
MCHC: 34.4 g/dL (ref 30.0–36.0)
MCV: 90.9 fL (ref 80.0–100.0)
Monocytes Absolute: 0.3 K/uL (ref 0.1–1.0)
Monocytes Relative: 10 %
Neutro Abs: 1.3 K/uL — ABNORMAL LOW (ref 1.7–7.7)
Neutrophils Relative %: 50 %
Platelets: 176 K/uL (ref 150–400)
RBC: 3.84 MIL/uL — ABNORMAL LOW (ref 4.22–5.81)
RDW: 14.5 % (ref 11.5–15.5)
WBC: 2.7 K/uL — ABNORMAL LOW (ref 4.0–10.5)
nRBC: 0 % (ref 0.0–0.2)

## 2023-09-14 MED ORDER — SODIUM CHLORIDE 0.9 % IV BOLUS
1000.0000 mL | Freq: Once | INTRAVENOUS | Status: AC
  Administered 2023-09-14: 1000 mL via INTRAVENOUS
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
## 2023-09-14 NOTE — Discharge Instructions (Signed)
 Your white blood cell count was low.  This should be rechecked by your primary care doctor.  I would go back to taking half of your blood pressure medication and monitor your blood pressures at home.  If it is getting too low, less than 100 on the top number then I would not take your blood pressure medication until you talk to your doctor.  Return to the emergency room if you have any worsening symptoms.
